# Patient Record
Sex: Female | Born: 1960 | Race: Black or African American | Hispanic: No | Marital: Married | Smoking: Never smoker
Health system: Southern US, Community
[De-identification: ages and names within clinical notes are randomized; demographics above are authoritative.]

## PROBLEM LIST (undated history)

## (undated) DIAGNOSIS — L309 Dermatitis, unspecified: Secondary | ICD-10-CM

## (undated) DIAGNOSIS — E039 Hypothyroidism, unspecified: Secondary | ICD-10-CM

## (undated) DIAGNOSIS — D219 Benign neoplasm of connective and other soft tissue, unspecified: Secondary | ICD-10-CM

## (undated) DIAGNOSIS — M4802 Spinal stenosis, cervical region: Secondary | ICD-10-CM

## (undated) DIAGNOSIS — I10 Essential (primary) hypertension: Secondary | ICD-10-CM

## (undated) DIAGNOSIS — Z5189 Encounter for other specified aftercare: Secondary | ICD-10-CM

## (undated) DIAGNOSIS — E559 Vitamin D deficiency, unspecified: Secondary | ICD-10-CM

## (undated) HISTORY — DX: Vitamin D deficiency, unspecified: E55.9

## (undated) HISTORY — DX: Encounter for other specified aftercare: Z51.89

## (undated) HISTORY — DX: Essential (primary) hypertension: I10

## (undated) HISTORY — DX: Dermatitis, unspecified: L30.9

## (undated) HISTORY — DX: Benign neoplasm of connective and other soft tissue, unspecified: D21.9

## (undated) HISTORY — DX: Spinal stenosis, cervical region: M48.02

## (undated) HISTORY — DX: Hypothyroidism, unspecified: E03.9

---

## 2000-06-07 ENCOUNTER — Other Ambulatory Visit: Admission: RE | Admit: 2000-06-07 | Discharge: 2000-06-07 | Payer: Self-pay | Admitting: Obstetrics and Gynecology

## 2001-01-01 ENCOUNTER — Ambulatory Visit (HOSPITAL_COMMUNITY): Admission: RE | Admit: 2001-01-01 | Discharge: 2001-01-01 | Payer: Self-pay | Admitting: Obstetrics and Gynecology

## 2001-01-01 ENCOUNTER — Encounter: Payer: Self-pay | Admitting: Obstetrics and Gynecology

## 2001-01-11 ENCOUNTER — Encounter: Admission: RE | Admit: 2001-01-11 | Discharge: 2001-01-11 | Payer: Self-pay | Admitting: Obstetrics and Gynecology

## 2001-01-11 ENCOUNTER — Encounter: Payer: Self-pay | Admitting: Obstetrics and Gynecology

## 2001-01-23 ENCOUNTER — Other Ambulatory Visit: Admission: RE | Admit: 2001-01-23 | Discharge: 2001-01-23 | Payer: Self-pay | Admitting: Obstetrics and Gynecology

## 2001-08-10 ENCOUNTER — Other Ambulatory Visit: Admission: RE | Admit: 2001-08-10 | Discharge: 2001-08-10 | Payer: Self-pay | Admitting: Obstetrics and Gynecology

## 2002-01-01 ENCOUNTER — Ambulatory Visit (HOSPITAL_COMMUNITY): Admission: RE | Admit: 2002-01-01 | Discharge: 2002-01-01 | Payer: Self-pay | Admitting: Obstetrics and Gynecology

## 2002-01-01 ENCOUNTER — Encounter: Payer: Self-pay | Admitting: Obstetrics and Gynecology

## 2002-10-01 ENCOUNTER — Other Ambulatory Visit: Admission: RE | Admit: 2002-10-01 | Discharge: 2002-10-01 | Payer: Self-pay | Admitting: Obstetrics and Gynecology

## 2003-01-02 ENCOUNTER — Encounter: Payer: Self-pay | Admitting: Obstetrics and Gynecology

## 2003-01-02 ENCOUNTER — Ambulatory Visit (HOSPITAL_COMMUNITY): Admission: RE | Admit: 2003-01-02 | Discharge: 2003-01-02 | Payer: Self-pay | Admitting: Obstetrics and Gynecology

## 2003-11-28 ENCOUNTER — Other Ambulatory Visit: Admission: RE | Admit: 2003-11-28 | Discharge: 2003-11-28 | Payer: Self-pay | Admitting: Obstetrics and Gynecology

## 2004-01-05 ENCOUNTER — Ambulatory Visit (HOSPITAL_COMMUNITY): Admission: RE | Admit: 2004-01-05 | Discharge: 2004-01-05 | Payer: Self-pay | Admitting: *Deleted

## 2004-01-09 ENCOUNTER — Encounter: Admission: RE | Admit: 2004-01-09 | Discharge: 2004-01-09 | Payer: Self-pay | Admitting: Obstetrics and Gynecology

## 2004-07-30 ENCOUNTER — Encounter: Admission: RE | Admit: 2004-07-30 | Discharge: 2004-07-30 | Payer: Self-pay | Admitting: Obstetrics and Gynecology

## 2004-11-29 ENCOUNTER — Other Ambulatory Visit: Admission: RE | Admit: 2004-11-29 | Discharge: 2004-11-29 | Payer: Self-pay | Admitting: Obstetrics and Gynecology

## 2004-12-06 ENCOUNTER — Encounter: Admission: RE | Admit: 2004-12-06 | Discharge: 2004-12-06 | Payer: Self-pay | Admitting: Obstetrics and Gynecology

## 2005-12-20 ENCOUNTER — Encounter: Admission: RE | Admit: 2005-12-20 | Discharge: 2005-12-20 | Payer: Self-pay | Admitting: Obstetrics and Gynecology

## 2005-12-29 ENCOUNTER — Encounter: Admission: RE | Admit: 2005-12-29 | Discharge: 2005-12-29 | Payer: Self-pay | Admitting: Obstetrics and Gynecology

## 2006-01-06 ENCOUNTER — Other Ambulatory Visit: Admission: RE | Admit: 2006-01-06 | Discharge: 2006-01-06 | Payer: Self-pay | Admitting: Obstetrics & Gynecology

## 2006-12-26 ENCOUNTER — Encounter: Admission: RE | Admit: 2006-12-26 | Discharge: 2006-12-26 | Payer: Self-pay | Admitting: Obstetrics and Gynecology

## 2007-01-18 ENCOUNTER — Other Ambulatory Visit: Admission: RE | Admit: 2007-01-18 | Discharge: 2007-01-18 | Payer: Self-pay | Admitting: Obstetrics & Gynecology

## 2007-12-27 ENCOUNTER — Encounter: Admission: RE | Admit: 2007-12-27 | Discharge: 2007-12-27 | Payer: Self-pay | Admitting: Obstetrics and Gynecology

## 2008-01-24 ENCOUNTER — Other Ambulatory Visit: Admission: RE | Admit: 2008-01-24 | Discharge: 2008-01-24 | Payer: Self-pay | Admitting: Obstetrics & Gynecology

## 2008-06-12 ENCOUNTER — Ambulatory Visit (HOSPITAL_COMMUNITY): Admission: RE | Admit: 2008-06-12 | Discharge: 2008-06-12 | Payer: Self-pay | Admitting: Pulmonary Disease

## 2009-01-02 ENCOUNTER — Encounter: Admission: RE | Admit: 2009-01-02 | Discharge: 2009-01-02 | Payer: Self-pay | Admitting: Obstetrics and Gynecology

## 2010-01-05 ENCOUNTER — Encounter: Admission: RE | Admit: 2010-01-05 | Discharge: 2010-01-05 | Payer: Self-pay | Admitting: Obstetrics and Gynecology

## 2010-06-27 ENCOUNTER — Encounter: Payer: Self-pay | Admitting: Otolaryngology

## 2010-06-27 ENCOUNTER — Encounter: Payer: Self-pay | Admitting: Obstetrics and Gynecology

## 2010-11-26 ENCOUNTER — Other Ambulatory Visit: Payer: Self-pay | Admitting: Obstetrics and Gynecology

## 2010-11-26 DIAGNOSIS — Z1231 Encounter for screening mammogram for malignant neoplasm of breast: Secondary | ICD-10-CM

## 2011-01-07 ENCOUNTER — Ambulatory Visit
Admission: RE | Admit: 2011-01-07 | Discharge: 2011-01-07 | Disposition: A | Discharge status: Home / Self Care | Payer: BC Managed Care – PPO | Source: Ambulatory Visit | Attending: Obstetrics and Gynecology | Admitting: Obstetrics and Gynecology

## 2011-01-07 DIAGNOSIS — Z1231 Encounter for screening mammogram for malignant neoplasm of breast: Secondary | ICD-10-CM

## 2011-12-19 ENCOUNTER — Other Ambulatory Visit: Payer: Self-pay | Admitting: Obstetrics and Gynecology

## 2011-12-19 DIAGNOSIS — Z1231 Encounter for screening mammogram for malignant neoplasm of breast: Secondary | ICD-10-CM

## 2012-01-09 ENCOUNTER — Ambulatory Visit
Admission: RE | Admit: 2012-01-09 | Discharge: 2012-01-09 | Disposition: A | Discharge status: Home / Self Care | Payer: BC Managed Care – PPO | Source: Ambulatory Visit | Attending: Obstetrics and Gynecology | Admitting: Obstetrics and Gynecology

## 2012-01-09 DIAGNOSIS — Z1231 Encounter for screening mammogram for malignant neoplasm of breast: Secondary | ICD-10-CM

## 2012-01-11 ENCOUNTER — Other Ambulatory Visit: Payer: Self-pay | Admitting: Obstetrics and Gynecology

## 2012-01-11 DIAGNOSIS — R928 Other abnormal and inconclusive findings on diagnostic imaging of breast: Secondary | ICD-10-CM

## 2012-01-18 ENCOUNTER — Ambulatory Visit
Admission: RE | Admit: 2012-01-18 | Discharge: 2012-01-18 | Disposition: A | Discharge status: Home / Self Care | Payer: BC Managed Care – PPO | Source: Ambulatory Visit | Attending: Obstetrics and Gynecology | Admitting: Obstetrics and Gynecology

## 2012-01-18 DIAGNOSIS — R928 Other abnormal and inconclusive findings on diagnostic imaging of breast: Secondary | ICD-10-CM

## 2012-12-13 ENCOUNTER — Other Ambulatory Visit: Payer: Self-pay

## 2012-12-13 DIAGNOSIS — Z1231 Encounter for screening mammogram for malignant neoplasm of breast: Secondary | ICD-10-CM

## 2013-01-09 ENCOUNTER — Ambulatory Visit
Admission: RE | Admit: 2013-01-09 | Discharge: 2013-01-09 | Disposition: A | Discharge status: Home / Self Care | Payer: BC Managed Care – PPO | Source: Ambulatory Visit

## 2013-01-09 DIAGNOSIS — Z1231 Encounter for screening mammogram for malignant neoplasm of breast: Secondary | ICD-10-CM

## 2013-01-22 ENCOUNTER — Other Ambulatory Visit (HOSPITAL_COMMUNITY): Payer: Self-pay | Admitting: Pulmonary Disease

## 2013-01-22 ENCOUNTER — Ambulatory Visit (INDEPENDENT_AMBULATORY_CARE_PROVIDER_SITE_OTHER): Payer: BC Managed Care – PPO | Admitting: Orthopedic Surgery

## 2013-01-22 ENCOUNTER — Ambulatory Visit (HOSPITAL_COMMUNITY)
Admission: RE | Admit: 2013-01-22 | Discharge: 2013-01-22 | Disposition: A | Discharge status: Home / Self Care | Payer: BC Managed Care – PPO | Source: Ambulatory Visit | Attending: Pulmonary Disease | Admitting: Pulmonary Disease

## 2013-01-22 ENCOUNTER — Encounter: Payer: Self-pay | Admitting: Orthopedic Surgery

## 2013-01-22 VITALS — BP 159/65 | Ht 67.5 in | Wt 185.0 lb

## 2013-01-22 DIAGNOSIS — S92901A Unspecified fracture of right foot, initial encounter for closed fracture: Secondary | ICD-10-CM

## 2013-01-22 DIAGNOSIS — S92909A Unspecified fracture of unspecified foot, initial encounter for closed fracture: Secondary | ICD-10-CM

## 2013-01-22 DIAGNOSIS — S92309A Fracture of unspecified metatarsal bone(s), unspecified foot, initial encounter for closed fracture: Secondary | ICD-10-CM | POA: Insufficient documentation

## 2013-01-22 DIAGNOSIS — W19XXXA Unspecified fall, initial encounter: Secondary | ICD-10-CM

## 2013-01-22 DIAGNOSIS — M25579 Pain in unspecified ankle and joints of unspecified foot: Secondary | ICD-10-CM | POA: Insufficient documentation

## 2013-01-22 MED ORDER — HYDROCODONE-ACETAMINOPHEN 5-325 MG PO TABS: 1.0000 | ORAL_TABLET | Freq: Four times a day (QID) | ORAL | Status: DC | PRN

## 2013-01-22 MED ORDER — IBUPROFEN 800 MG PO TABS: 800.0000 mg | ORAL_TABLET | Freq: Three times a day (TID) | ORAL | Status: DC | PRN

## 2013-01-22 NOTE — Progress Notes (Signed)
Patient ID: Jessica Mooney, female   DOB: 02-16-1961, 52 y.o.   MRN: [id number]  Chief Complaint  Patient presents with  . Foot Pain    5th metatarsal fracture right foot DOI 01/21/13 Referral from Dr. Juanetta Gosling    This patient fell down the stairs at her home on the 18th she injured her right foot she went to work she called her doctor he sent her for x-ray x-ray shows fracture of the fifth metatarsal in the shaft with minimal displacement. She has mild discomfort pain swelling minimal if any numbness or tingling  Pain level VII/X described as sharp and throbbing  Review of Systems  Endo/Heme/Allergies: Positive for environmental allergies.  All other systems reviewed and are negative.   BP 159/65  Ht 5' 7.5" (1.715 m)  Wt 185 lb (83.915 kg)  BMI 28.53 kg/m2  General appearance is normal, the patient is alert and oriented x3 with normal mood and affect.  The patient is angulating on her right foot primarily with weightbearing through the heel  She has no supportive devices.  Her right foot is swollen and tender over the fifth metatarsal the entire foot is swollen. Her ankle motion is not been effective Homero Fellers remained stable muscle tone is normal without atrophy skin is intact with ecchymosis but no lacerations. Pulses good sensation is normal  X-ray shows an oblique comminuted fracture of the fifth metatarsal the right foot without significant displacement  Recommend Cam Walker nonweightbearing for 8 weeks including a knee walker  Followup in 4 weeks for x-ray to check on healing and position of the fracture.

## 2013-01-22 NOTE — Patient Instructions (Signed)
Not weight bearing for 8 weeks

## 2013-02-19 ENCOUNTER — Ambulatory Visit: Payer: BC Managed Care – PPO | Admitting: Orthopedic Surgery

## 2013-03-05 ENCOUNTER — Ambulatory Visit (INDEPENDENT_AMBULATORY_CARE_PROVIDER_SITE_OTHER): Payer: BC Managed Care – PPO | Admitting: Orthopedic Surgery

## 2013-03-05 ENCOUNTER — Ambulatory Visit (INDEPENDENT_AMBULATORY_CARE_PROVIDER_SITE_OTHER): Payer: BC Managed Care – PPO

## 2013-03-05 VITALS — BP 159/99 | Ht 67.5 in | Wt 185.0 lb

## 2013-03-05 DIAGNOSIS — S92901D Unspecified fracture of right foot, subsequent encounter for fracture with routine healing: Secondary | ICD-10-CM

## 2013-03-05 DIAGNOSIS — S8290XD Unspecified fracture of unspecified lower leg, subsequent encounter for closed fracture with routine healing: Secondary | ICD-10-CM

## 2013-03-05 NOTE — Progress Notes (Signed)
Patient ID: Jessica Mooney, female   DOB: [birth year], 52 y.o.   MRN: [id number]  Chief Complaint  Patient presents with  . Follow-up    4 week recheck right foot with xray.   HISTORY: This is the four-week followup 6 weeks post injury on August 18 for a right fifth metatarsal fracture which was treated with a Cam Walker and minimal weightbearing using a knee walker.  She complains of numbness and tingling at the end of the fifth digit and between the fourth and fifth digit  X-ray shows healing nondisplaced oblique fifth metatarsal fracture  Recommend weight bear as tolerated in a Cam Walker return in 4 weeks for repeat x-ray clinical exam revealed tenderness at the fracture site. The numbness and tingling chronic swelling but perhaps may be due to entrapment of the digital nerve and the fracture callus  Encounter Diagnosis  Name Primary?  Marland Kitchen Foot fracture, right, with routine healing, subsequent encounter Yes

## 2013-03-05 NOTE — Patient Instructions (Addendum)
Weight bear as tolerated in boot

## 2013-03-07 ENCOUNTER — Ambulatory Visit: Payer: BC Managed Care – PPO | Admitting: Orthopedic Surgery

## 2013-04-02 ENCOUNTER — Ambulatory Visit: Payer: BC Managed Care – PPO | Admitting: Orthopedic Surgery

## 2013-04-04 ENCOUNTER — Ambulatory Visit (INDEPENDENT_AMBULATORY_CARE_PROVIDER_SITE_OTHER): Payer: BC Managed Care – PPO

## 2013-04-04 ENCOUNTER — Ambulatory Visit (INDEPENDENT_AMBULATORY_CARE_PROVIDER_SITE_OTHER): Payer: BC Managed Care – PPO | Admitting: Orthopedic Surgery

## 2013-04-04 VITALS — BP 172/110 | Ht 67.5 in | Wt 185.0 lb

## 2013-04-04 DIAGNOSIS — S92909A Unspecified fracture of unspecified foot, initial encounter for closed fracture: Secondary | ICD-10-CM | POA: Insufficient documentation

## 2013-04-04 DIAGNOSIS — S8290XD Unspecified fracture of unspecified lower leg, subsequent encounter for closed fracture with routine healing: Secondary | ICD-10-CM

## 2013-04-04 DIAGNOSIS — S92901D Unspecified fracture of right foot, subsequent encounter for fracture with routine healing: Secondary | ICD-10-CM

## 2013-04-04 NOTE — Patient Instructions (Signed)
You can take off the boot and wear regular shoes. Wait 2 weeks for high heel shoes

## 2013-04-04 NOTE — Progress Notes (Signed)
Patient ID: Jessica Mooney, female   DOB: 02/16/1961, 52 y.o.   MRN: [id number] Chief Complaint  Patient presents with  . Follow-up    4 week recheck right foot fracture DOI 01/21/13   This is the eighth week since the original fracture x-ray show fracture healing patient's symptoms have all but gone away her for Lyme looks good she has no tenderness she is encouraged to return to normal activities in normal shoes

## 2013-04-16 ENCOUNTER — Telehealth: Payer: Self-pay | Admitting: Orthopedic Surgery

## 2013-04-16 NOTE — Telephone Encounter (Signed)
Jessica Mooney is still having a problem trying to wear regular shoes.  She has bought a good pair of tennis shoes but her foot still swells.  Says she elevates and ices  As much as she can.  She said she is still limping, but that may be just habit.  She is asking what type of shoe or brace do you advise she wear.

## 2013-04-17 NOTE — Telephone Encounter (Signed)
No shoe or brace  Recommend TED hose

## 2013-04-17 NOTE — Telephone Encounter (Signed)
Patient informed of Dr. Harrison's reply. 

## 2013-05-16 ENCOUNTER — Encounter: Payer: Self-pay | Admitting: Obstetrics and Gynecology

## 2013-05-17 ENCOUNTER — Encounter: Payer: Self-pay | Admitting: Obstetrics and Gynecology

## 2013-05-17 ENCOUNTER — Ambulatory Visit: Payer: Self-pay | Admitting: Obstetrics and Gynecology

## 2013-05-17 ENCOUNTER — Ambulatory Visit (INDEPENDENT_AMBULATORY_CARE_PROVIDER_SITE_OTHER): Payer: BC Managed Care – PPO | Admitting: Obstetrics and Gynecology

## 2013-05-17 VITALS — BP 140/84 | HR 80 | Resp 18 | Ht 67.25 in | Wt 223.0 lb

## 2013-05-17 DIAGNOSIS — Z1211 Encounter for screening for malignant neoplasm of colon: Secondary | ICD-10-CM

## 2013-05-17 DIAGNOSIS — Z0000 Encounter for general adult medical examination without abnormal findings: Secondary | ICD-10-CM

## 2013-05-17 DIAGNOSIS — Z01419 Encounter for gynecological examination (general) (routine) without abnormal findings: Secondary | ICD-10-CM

## 2013-05-17 LAB — POCT URINALYSIS DIPSTICK
Blood, UA: NEGATIVE
Glucose, UA: NEGATIVE
Urobilinogen, UA: NEGATIVE
pH, UA: 5

## 2013-05-17 NOTE — Progress Notes (Signed)
Patient ID: Jessica Mooney, female   DOB: Feb 18, 1961, 52 y.o.   MRN: [id number] GYNECOLOGY VISIT  PCP:    Kari Baars, MD  Referring provider:   HPI: 52 y.o.   Married  [place] American  female   [phone] with Patient's last menstrual period was 07/07/2010.   here for  AEX.  Hot flashes.  Not sleeping well at night.  Her two adopted daughters are staying with their birth family now, and patient does not have contact with them. She hears about them only through friends who have seen them Worried about one of the daughters who is on a liver transplant list.   Broke right foot in August - twisted foot going down the steps at home. Wore a boot for months and has been inactive due to this.   Hgb:    PCP Urine:  Trace WBC's (asymptomatic)  GYNECOLOGIC HISTORY: Patient's last menstrual period was 07/07/2010. Sexually active:  yes Partner preference: female Contraception: postmenopausal   Menopausal hormone therapy: none DES exposure:   no Blood transfusions:   30 years ago after c-section Sexually transmitted diseases:   no GYN Procedures: C-section 1989  Mammogram:   01-09-13 wnl:[facility]              Pap:  05-01-12 wnl:neg HR HPV  History of abnormal pap smear:  Abnormal pap 10-15 years ago in [place].  Patient transferred care To our practice and pap was repeated and was completely normal.  No colposcopy was done and  paps have been normal since.   OB History   Grav Para Term Preterm Abortions TAB SAB Ect Mult Living   4 2 1  2     1        LIFESTYLE: Exercise:   walking            Tobacco:   no Alcohol:      no Drug use:   no  OTHER HEALTH MAINTENANCE: Tetanus/TDap:  2011 Gardisil:  n/a Influenza:  never Zostavax:  n/a  Bone density:  never Colonoscopy:  never  Cholesterol check: 2014 wnl  Family History  Problem Relation Age of Onset  . Breast cancer Sister 45  . Hypertension Mother   . Thyroid disease Mother     thyroidectomy  . Hypertension  Father   . Stroke Maternal Grandfather     Patient Active Problem List   Diagnosis Date Noted  . Foot fracture 04/04/2013   Past Medical History  Diagnosis Date  . Hypertension   . Fibroid   . Blood transfusion without reported diagnosis     age 24    Past Surgical History  Procedure Laterality Date  . Cesarean section  1989    ALLERGIES: Sulfur  Current Outpatient Prescriptions  Medication Sig Dispense Refill  . bisoprolol-hydrochlorothiazide (ZIAC) 5-6.25 MG per tablet Take 1 tablet by mouth daily.      . Multiple Vitamin (MULTIVITAMIN) tablet Take 1 tablet by mouth daily.      Marland Kitchen olmesartan (BENICAR) 40 MG tablet Take 40 mg by mouth daily.       No current facility-administered medications for this visit.     ROS:  Pertinent items are noted in HPI.  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  PHYSICAL EXAMINATION:    BP 140/84  Pulse 80  Resp 18  Ht 5' 7.25" (1.708 m)  Wt 223 lb (101.152 kg)  BMI 34.67 kg/m2  LMP 07/07/2010   Wt  Readings from Last 3 Encounters:  05/17/13 223 lb (101.152 kg)  04/04/13 185 lb (83.915 kg)  03/05/13 185 lb (83.915 kg)     Ht Readings from Last 3 Encounters:  05/17/13 5' 7.25" (1.708 m)  04/04/13 5' 7.5" (1.715 m)  03/05/13 5' 7.5" (1.715 m)    General appearance: alert, cooperative and appears stated age Head: Normocephalic, without obvious abnormality, atraumatic Neck: no adenopathy, supple, symmetrical, trachea midline and thyroid not enlarged, symmetric, no tenderness/mass/nodules Lungs: clear to auscultation bilaterally Breasts: Inspection negative, No nipple retraction or dimpling, No nipple discharge or bleeding, No axillary or supraclavicular adenopathy, Normal to palpation without dominant masses Heart: regular rate and rhythm Abdomen: soft, non-tender; no masses,  no organomegaly Extremities: extremities normal, atraumatic, no cyanosis or edema Skin: Skin color, texture, turgor normal. No rashes or  lesions Lymph nodes: Cervical, supraclavicular, and axillary nodes normal. No abnormal inguinal nodes palpated Neurologic: Grossly normal  Pelvic: External genitalia:  no lesions              Urethra:  normal appearing urethra with no masses, tenderness or lesions              Bartholins and Skenes: normal                 Vagina: normal appearing vagina with normal color and discharge, left vaginal wall cyst 1.5 - soft and nontender.              Cervix: normal appearance              Pap and high risk HPV testing done: no.            Bimanual Exam:  Uterus:  uterus is normal size, shape, consistency and nontender                                      Adnexa: normal adnexa in size, nontender and no masses                                      Rectovaginal: Confirms                                      Anus:  normal sphincter tone, no lesions  ASSESSMENT  Normal gynecologic exam. Menopausal symptoms.  Fracture of foot.  PLAN  Mammogram yearly.  Pap smear and high risk HPV testing not indicated.  Colonoscopy due. Referral to Dr. Loreta Ave.  Consider doing bone density next year due to foot fracture. Discussed adequate calcium and vitamin D intake.  Return annually or prn   An After Visit Summary was printed and given to the patient.

## 2013-06-07 ENCOUNTER — Telehealth: Payer: Self-pay | Admitting: Obstetrics and Gynecology

## 2013-06-07 NOTE — Telephone Encounter (Signed)
Return call to patient.  Per last OV note from Dr Quincy Simmonds, refer to Dr Collene Mares ([phone])for colonoscopy. LMTCB.

## 2013-06-07 NOTE — Telephone Encounter (Signed)
Patient said we referred her to someone for a colonoscopy but she doesn't remember who it was to.

## 2013-06-10 ENCOUNTER — Telehealth: Payer: Self-pay | Admitting: Obstetrics and Gynecology

## 2013-06-10 NOTE — Telephone Encounter (Signed)
Voicemail confirmed mobile telephone #. Left message advising patient that she is scheduled to see Dr Collene Mares Jul 04, 2013 @4p .

## 2013-12-02 ENCOUNTER — Other Ambulatory Visit: Payer: Self-pay

## 2013-12-02 DIAGNOSIS — Z1231 Encounter for screening mammogram for malignant neoplasm of breast: Secondary | ICD-10-CM

## 2014-01-10 ENCOUNTER — Ambulatory Visit
Admission: RE | Admit: 2014-01-10 | Discharge: 2014-01-10 | Disposition: A | Discharge status: Home / Self Care | Payer: BC Managed Care – PPO | Source: Ambulatory Visit

## 2014-01-10 DIAGNOSIS — Z1231 Encounter for screening mammogram for malignant neoplasm of breast: Secondary | ICD-10-CM

## 2014-02-05 ENCOUNTER — Other Ambulatory Visit (HOSPITAL_COMMUNITY): Payer: Self-pay | Admitting: Pulmonary Disease

## 2014-02-05 DIAGNOSIS — Z780 Asymptomatic menopausal state: Secondary | ICD-10-CM

## 2014-02-11 ENCOUNTER — Ambulatory Visit (HOSPITAL_COMMUNITY)
Admission: RE | Admit: 2014-02-11 | Discharge: 2014-02-11 | Disposition: A | Discharge status: Home / Self Care | Payer: BC Managed Care – PPO | Source: Ambulatory Visit | Attending: Pulmonary Disease | Admitting: Pulmonary Disease

## 2014-02-11 DIAGNOSIS — Z13820 Encounter for screening for osteoporosis: Secondary | ICD-10-CM | POA: Diagnosis not present

## 2014-02-11 DIAGNOSIS — Z780 Asymptomatic menopausal state: Secondary | ICD-10-CM | POA: Diagnosis present

## 2014-02-11 LAB — HM DEXA SCAN: HM Dexa Scan: NORMAL

## 2014-02-18 ENCOUNTER — Encounter: Payer: Self-pay | Admitting: Obstetrics and Gynecology

## 2014-04-07 ENCOUNTER — Encounter: Payer: Self-pay | Admitting: Obstetrics and Gynecology

## 2014-05-23 ENCOUNTER — Encounter: Payer: Self-pay | Admitting: Obstetrics and Gynecology

## 2014-05-23 ENCOUNTER — Ambulatory Visit (INDEPENDENT_AMBULATORY_CARE_PROVIDER_SITE_OTHER): Payer: BC Managed Care – PPO | Admitting: Obstetrics and Gynecology

## 2014-05-23 ENCOUNTER — Ambulatory Visit: Payer: BC Managed Care – PPO | Admitting: Obstetrics and Gynecology

## 2014-05-23 VITALS — BP 132/86 | HR 64 | Resp 16 | Ht 68.0 in | Wt 234.4 lb

## 2014-05-23 DIAGNOSIS — R82998 Other abnormal findings in urine: Secondary | ICD-10-CM

## 2014-05-23 DIAGNOSIS — Z01419 Encounter for gynecological examination (general) (routine) without abnormal findings: Secondary | ICD-10-CM

## 2014-05-23 DIAGNOSIS — N390 Urinary tract infection, site not specified: Secondary | ICD-10-CM

## 2014-05-23 DIAGNOSIS — Z0000 Encounter for general adult medical examination without abnormal findings: Secondary | ICD-10-CM

## 2014-05-23 LAB — POCT URINALYSIS DIPSTICK
Bilirubin, UA: NEGATIVE
Blood, UA: NEGATIVE
Glucose, UA: NEGATIVE
Ketones, UA: NEGATIVE
Nitrite, UA: NEGATIVE
Protein, UA: NEGATIVE
Urobilinogen, UA: NEGATIVE
pH, UA: 5

## 2014-05-23 NOTE — Progress Notes (Signed)
Patient ID: Jessica Mooney, female   DOB: Feb 17, 1961, 53 y.o.   MRN: [id number] 53 y.o. [id number] MarriedAfrican AmericanF here for annual exam.    Still with some hot flashes.  Declines Rx.   Daughter in ICU at [facility].  Has ascites and is on a liver transplant list.  Will be discharged on Monday or Tuesday.   Has more contact with her adopted daughters this year.   Working at [employer].  Going to [place].   PCP:  Jessica Du, MD  Patient's last menstrual period was 07/07/2010.          Sexually active: Yes.   female The current method of family planning is post menopausal status.    Exercising: Yes.    walking. Smoker:  no  Health Maintenance: Pap:  05-01-12 wnl:neg HR HPV History of abnormal Pap:  no MMG:  01-10-14 heterogeneously dense/nl:[facility] Colonoscopy:  Scheduled for first colonoscopy 06/2014 with Dr. Collene Mooney. BMD:   02-11-14 normal:Jessica Mooney TDaP:  2011 Screening Labs:  Hb today: PCP, Urine today: 1+WBC's - no dysuria. Last UTI was a long time ago.    reports that she has never smoked. She does not have any smokeless tobacco history on file. She reports that she does not drink alcohol or use illicit drugs.  Past Medical History  Diagnosis Date  . Hypertension   . Fibroid   . Blood transfusion without reported diagnosis     age 81    Past Surgical History  Procedure Laterality Date  . Cesarean section  1989    Current Outpatient Prescriptions  Medication Sig Dispense Refill  . amLODipine (NORVASC) 5 MG tablet Take 5 mg by mouth daily.    . bisoprolol-hydrochlorothiazide (ZIAC) 5-6.25 MG per tablet Take 1 tablet by mouth daily.    . Multiple Vitamin (MULTIVITAMIN) tablet Take 1 tablet by mouth daily.     No current facility-administered medications for this visit.    Family History  Problem Relation Age of Onset  . Breast cancer Sister 41  . Hypertension Mother   . Thyroid disease Mother     thyroidectomy  .  Hypertension Father   . Stroke Maternal Grandfather     ROS:  Pertinent items are noted in HPI.  Otherwise, a comprehensive ROS was negative.  Exam:   BP 132/86 mmHg  Pulse 64  Resp 16  Ht 5\' 8"  (1.727 m)  Wt 234 lb 6.4 oz (106.323 kg)  BMI 35.65 kg/m2  LMP 07/07/2010      Height: 5\' 8"  (172.7 cm)  Ht Readings from Last 3 Encounters:  05/23/14 5\' 8"  (1.727 m)  05/17/13 5' 7.25" (1.708 m)  04/04/13 5' 7.5" (1.715 m)    General appearance: alert, cooperative and appears stated age Head: Normocephalic, without obvious abnormality, atraumatic Neck: no adenopathy, supple, symmetrical, trachea midline and thyroid normal to inspection and palpation Lungs: clear to auscultation bilaterally Breasts: normal appearance, no masses or tenderness, Inspection negative, No nipple retraction or dimpling, No nipple discharge or bleeding, No axillary or supraclavicular adenopathy Heart: regular rate and rhythm Abdomen: low vertical midline incision, soft, non-tender; bowel sounds normal; no masses,  no organomegaly Extremities: extremities normal, atraumatic, no cyanosis or edema Skin: Skin color, texture, turgor normal. No rashes or lesions Lymph nodes: Cervical, supraclavicular, and axillary nodes normal. No abnormal inguinal nodes palpated Neurologic: Grossly normal   Pelvic: External genitalia:  no lesions  Urethra:  normal appearing urethra with no masses, tenderness or lesions              Bartholins and Skenes: normal                 Vagina: normal appearing vagina with normal color and discharge, no lesions              Cervix: no lesions              Pap taken: No. Bimanual Exam:  Uterus:  normal size, contour, position, consistency, mobility, non-tender              Adnexa: normal adnexa and no mass, fullness, tenderness               Rectovaginal: Confirms               Anus:  normal sphincter tone, no lesions  Chaperone was present for exam.  A:  Well Woman with  normal exam FH breast cancer.  Abnormal urine dip.  P:   Mammogram yearly.  pap smear not indicated.  Urine culture.  return annually or prn

## 2014-05-23 NOTE — Patient Instructions (Signed)

## 2014-05-25 LAB — URINE CULTURE

## 2014-06-06 DIAGNOSIS — M4802 Spinal stenosis, cervical region: Secondary | ICD-10-CM

## 2014-06-06 HISTORY — DX: Spinal stenosis, cervical region: M48.02

## 2014-12-04 ENCOUNTER — Other Ambulatory Visit: Payer: Self-pay

## 2014-12-04 DIAGNOSIS — Z1231 Encounter for screening mammogram for malignant neoplasm of breast: Secondary | ICD-10-CM

## 2015-01-09 ENCOUNTER — Ambulatory Visit (HOSPITAL_COMMUNITY)
Admission: RE | Admit: 2015-01-09 | Discharge: 2015-01-09 | Disposition: A | Discharge status: Home / Self Care | Payer: BC Managed Care – PPO | Source: Ambulatory Visit | Attending: Pulmonary Disease | Admitting: Pulmonary Disease

## 2015-01-09 ENCOUNTER — Other Ambulatory Visit (HOSPITAL_COMMUNITY): Payer: Self-pay | Admitting: Pulmonary Disease

## 2015-01-09 DIAGNOSIS — M542 Cervicalgia: Secondary | ICD-10-CM | POA: Insufficient documentation

## 2015-01-09 DIAGNOSIS — M2560 Stiffness of unspecified joint, not elsewhere classified: Secondary | ICD-10-CM

## 2015-01-09 DIAGNOSIS — R202 Paresthesia of skin: Secondary | ICD-10-CM | POA: Diagnosis not present

## 2015-01-09 DIAGNOSIS — M5032 Other cervical disc degeneration, mid-cervical region: Secondary | ICD-10-CM | POA: Diagnosis not present

## 2015-01-14 ENCOUNTER — Ambulatory Visit
Admission: RE | Admit: 2015-01-14 | Discharge: 2015-01-14 | Disposition: A | Discharge status: Home / Self Care | Payer: BC Managed Care – PPO | Source: Ambulatory Visit

## 2015-01-14 DIAGNOSIS — Z1231 Encounter for screening mammogram for malignant neoplasm of breast: Secondary | ICD-10-CM

## 2015-02-20 ENCOUNTER — Other Ambulatory Visit (HOSPITAL_COMMUNITY): Payer: Self-pay | Admitting: Pulmonary Disease

## 2015-02-20 DIAGNOSIS — M542 Cervicalgia: Secondary | ICD-10-CM

## 2015-03-04 ENCOUNTER — Ambulatory Visit (HOSPITAL_COMMUNITY)
Admission: RE | Admit: 2015-03-04 | Discharge: 2015-03-04 | Disposition: A | Discharge status: Home / Self Care | Payer: BC Managed Care – PPO | Source: Ambulatory Visit | Attending: Pulmonary Disease | Admitting: Pulmonary Disease

## 2015-03-04 DIAGNOSIS — M542 Cervicalgia: Secondary | ICD-10-CM

## 2015-03-04 DIAGNOSIS — M4802 Spinal stenosis, cervical region: Secondary | ICD-10-CM | POA: Diagnosis not present

## 2015-06-12 ENCOUNTER — Ambulatory Visit (INDEPENDENT_AMBULATORY_CARE_PROVIDER_SITE_OTHER): Payer: BC Managed Care – PPO | Admitting: Obstetrics and Gynecology

## 2015-06-12 ENCOUNTER — Encounter: Payer: Self-pay | Admitting: Obstetrics and Gynecology

## 2015-06-12 ENCOUNTER — Ambulatory Visit: Payer: BC Managed Care – PPO | Admitting: Obstetrics and Gynecology

## 2015-06-12 VITALS — BP 124/76 | HR 60 | Ht 67.25 in | Wt 223.0 lb

## 2015-06-12 DIAGNOSIS — Z0000 Encounter for general adult medical examination without abnormal findings: Secondary | ICD-10-CM | POA: Diagnosis not present

## 2015-06-12 DIAGNOSIS — N390 Urinary tract infection, site not specified: Secondary | ICD-10-CM

## 2015-06-12 DIAGNOSIS — Z01419 Encounter for gynecological examination (general) (routine) without abnormal findings: Secondary | ICD-10-CM

## 2015-06-12 DIAGNOSIS — R82998 Other abnormal findings in urine: Secondary | ICD-10-CM

## 2015-06-12 LAB — POCT URINALYSIS DIPSTICK
BILIRUBIN UA: NEGATIVE
Blood, UA: NEGATIVE
GLUCOSE UA: NEGATIVE
Ketones, UA: NEGATIVE
Nitrite, UA: NEGATIVE
Protein, UA: NEGATIVE
UROBILINOGEN UA: NEGATIVE
pH, UA: 5

## 2015-06-12 NOTE — Patient Instructions (Signed)

## 2015-06-12 NOTE — Progress Notes (Signed)
Patient ID: Jessica Mooney, female   DOB: 02-09-1961, 55 y.o.   MRN: [id number]  55 y.o. [id number] Married [place] American female here for annual exam.    Had problems with right arm and right leg numbness.  Had an MRI and was diagnosed with cervical spine foraminal stenosis. Is now treated for pain.  Has LLQ pain every now and then.  U/S several years ago - normal  This is not a problem for the patient.   New diagnosis of hypothyroidism.  Not sure if she feels different taking thyroid hormone.   Struggling with weight.  Taking vit D 10,000 IU daily for vit D deficiency.  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  Hoping to be at [facility] [employer] [facility].  PCP: Sinda Du, MD    Patient's last menstrual period was 07/07/2010.          Sexually active: Yes.    The current method of family planning is post menopausal status.    Exercising: Yes.    walking almost everyday Smoker:  no  Health Maintenance: Pap:  05/01/12, negative with neg HR HPV History of abnormal Pap:  no MMG: 01/14/15, 3D, heterogeneously dense/nl:[facility] Colonoscopy:Never.  Difficulty with prep so unable to complete the colonoscopy.  BMD: 02-11-14 normal:[facility] TDaP:  2011 Screening Labs: October 2016; Urine Dip:1+WBCs--sent for urine culture & Micro.  Sometimes has urgency.     reports that she has never smoked. She has never used smokeless tobacco. She reports that she does not drink alcohol or use illicit drugs.  Past Medical History  Diagnosis Date  . Hypertension   . Fibroid   . Blood transfusion without reported diagnosis     age 68  . Hypothyroidism   . Vitamin D deficiency   . Foraminal stenosis of cervical region 2016    Past Surgical History  Procedure Laterality Date  . Cesarean section  1989    Current Outpatient Prescriptions  Medication Sig Dispense Refill  . amLODipine (NORVASC) 5 MG tablet Take 5 mg by mouth daily.    Francia Greaves THYROID 15 MG tablet Take 1 tablet by mouth daily.     . bisoprolol-hydrochlorothiazide (ZIAC) 5-6.25 MG per tablet Take 1 tablet by mouth daily.    . Multiple Vitamin (MULTIVITAMIN) tablet Take 1 tablet by mouth daily.    Marland Kitchen TROKENDI XR 25 MG CP24 Take 1 tablet by mouth daily.     No current facility-administered medications for this visit.    Family History  Problem Relation Age of Onset  . Breast cancer Sister 12  . Hypertension Mother   . Thyroid disease Mother     thyroidectomy  . Hypertension Father   . Stroke Maternal Grandfather     ROS:  Pertinent items are noted in HPI.  Otherwise, a comprehensive ROS was negative.  Exam:   BP 124/76 mmHg  Pulse 60  Ht 5' 7.25" (1.708 m)  Wt 223 lb (101.152 kg)  BMI 34.67 kg/m2  LMP 07/07/2010    General appearance: alert, cooperative and appears stated age Head: Normocephalic, without obvious abnormality, atraumatic Neck: no adenopathy, supple, symmetrical, trachea midline and thyroid normal to inspection and palpation Lungs: clear to auscultation bilaterally Breasts: normal appearance, no masses or tenderness, Inspection negative, No nipple retraction or dimpling, No nipple discharge or bleeding, No axillary or supraclavicular adenopathy Heart: regular rate and rhythm Abdomen: Pfannenstiel incision and low vertical midline incision, soft, non-tender; bowel sounds normal; no masses,  no organomegaly Extremities: extremities normal, atraumatic,  no cyanosis or edema Skin: Skin color, texture, turgor normal. No rashes or lesions Lymph nodes: Cervical, supraclavicular, and axillary nodes normal. No abnormal inguinal nodes palpated Neurologic: Grossly normal  Pelvic: External genitalia:  no lesions              Urethra:  normal appearing urethra with no masses, tenderness or lesions              Bartholins and Skenes: normal                 Vagina: normal appearing vagina with normal color and discharge, no lesions              Cervix: no lesions              Pap taken: Yes.   Bimanual  Exam:  Uterus:  normal size, contour, position, consistency, mobility, non-tender              Adnexa: normal adnexa and no mass, fullness, tenderness              Rectovaginal: Yes.  .  Confirms.              Anus:  normal sphincter tone, no lesions  Chaperone was present for exam.  Assessment:   Well woman visit with normal exam. FH of breast cancer.  Leukocytes in urine.  Urgency - not consistent. Hypothyroidism.  Menopausal female.   Plan: Yearly mammogram recommended after age 31.  Recommended self breast exam.  Pap and HR HPV as above. Discussed Calcium, Vitamin D, regular exercise program including cardiovascular and weight bearing exercise. Labs performed.  Yes.  Urine micro and culture. No abx at this time. Refills given on medications.  No..    Discussion of alternatives to colonoscopy.  Patient will return to her GI or seek second opinion at another GI office.  Discussed hypothyroidism and the importance of continued treatment.  Follow up annually and prn.     After visit summary provided.

## 2015-06-13 ENCOUNTER — Encounter: Payer: Self-pay | Admitting: Obstetrics and Gynecology

## 2015-06-13 LAB — URINALYSIS, MICROSCOPIC ONLY
BACTERIA UA: NONE SEEN [HPF]
Casts: NONE SEEN [LPF]
Crystals: NONE SEEN [HPF]
RBC / HPF: NONE SEEN RBC/HPF (ref <=2)
SQUAMOUS EPITHELIAL / LPF: NONE SEEN [HPF] (ref <=5)
YEAST: NONE SEEN [HPF]

## 2015-06-14 LAB — URINE CULTURE: Colony Count: >=100000

## 2015-06-16 LAB — IPS PAP TEST WITH HPV

## 2015-06-22 ENCOUNTER — Other Ambulatory Visit: Payer: Self-pay

## 2015-06-22 ENCOUNTER — Telehealth: Payer: Self-pay | Admitting: Obstetrics and Gynecology

## 2015-06-22 DIAGNOSIS — Z1231 Encounter for screening mammogram for malignant neoplasm of breast: Secondary | ICD-10-CM

## 2015-06-22 NOTE — Telephone Encounter (Signed)
Patient received results thru Mychart but didn't understand them and would like a call from the nurse to go over.

## 2015-06-22 NOTE — Telephone Encounter (Signed)
Notes Recorded by Lowella Fairy, CMA on 06/22/2015 at 9:14 AM 06-11-16 02 recall entered. Notes Recorded by Nunzio Cobbs, MD on 06/18/2015 at 2:48 PM Pap and HR HPV negative.  Recall - 02. Notes Recorded by Nunzio Cobbs, MD on 06/15/2015 at 1:12 PM Results to patient through My Chart. No treatment for UTI at this time. Notes Recorded by Nunzio Cobbs, MD on 06/13/2015 at 2:39 PM Results to patient through My Chart. Urine micro to patient.  UC and pap pending.  Patient Result Comments     Entered by Nunzio Cobbs, MD at 06/15/2015 1:11 PM    Read by Lyda Perone at 06/18/2015 9:36 PM    Hello Ms. Mandarino,   Your urine culture result is showing mixed bacterial organisms. No one predominant organism grew, so this is considered a negative urine culture.    I am not planning to treat with antibiotics unless you contact the office back and are having pain or discomfort with urination.   Please call the office for any concerns!   Josefa Half, MD

## 2015-06-22 NOTE — Telephone Encounter (Signed)
Spoke with patient. Advised of message as seen below from [place]. All questions answered. Patient verbalizes understanding.  Routing to provider for final review. Patient agreeable to disposition. Will close encounter.

## 2015-12-21 ENCOUNTER — Ambulatory Visit (INDEPENDENT_AMBULATORY_CARE_PROVIDER_SITE_OTHER): Payer: BC Managed Care – PPO | Admitting: Otolaryngology

## 2015-12-21 DIAGNOSIS — H8111 Benign paroxysmal vertigo, right ear: Secondary | ICD-10-CM | POA: Diagnosis not present

## 2015-12-21 DIAGNOSIS — H6123 Impacted cerumen, bilateral: Secondary | ICD-10-CM | POA: Diagnosis not present

## 2015-12-21 DIAGNOSIS — R42 Dizziness and giddiness: Secondary | ICD-10-CM | POA: Diagnosis not present

## 2016-01-18 ENCOUNTER — Ambulatory Visit
Admission: RE | Admit: 2016-01-18 | Discharge: 2016-01-18 | Disposition: A | Discharge status: Home / Self Care | Payer: BC Managed Care – PPO | Source: Ambulatory Visit

## 2016-01-18 ENCOUNTER — Ambulatory Visit (INDEPENDENT_AMBULATORY_CARE_PROVIDER_SITE_OTHER): Payer: BC Managed Care – PPO | Admitting: Otolaryngology

## 2016-01-18 DIAGNOSIS — Z1231 Encounter for screening mammogram for malignant neoplasm of breast: Secondary | ICD-10-CM

## 2016-01-18 DIAGNOSIS — H8111 Benign paroxysmal vertigo, right ear: Secondary | ICD-10-CM

## 2016-06-10 NOTE — Progress Notes (Deleted)
56 y.o. [id number] Married [place] American female here for annual exam.    PCP:     Patient's last menstrual period was 07/07/2010.           Sexually active: {yes no:314532}  The current method of family planning is post menopausal status.    Exercising: {yes no:314532}  {types:19826} Smoker:  no  Health Maintenance: Pap:  06-15-15 Neg:Neg HR HPV History of abnormal Pap:  no MMG:  01-18-16 Density C/Neg/BiRads1:TBC Colonoscopy: Never.  Difficulty with prep so unable to complete the colonoscopy.   BMD:  02-11-14  Result  Normal:[place] TDaP:  2011 Gardasil:   N/A HIV: Hep C: Screening Labs:  Hb today: ***, Urine today: ***   reports that she has never smoked. She has never used smokeless tobacco. She reports that she does not drink alcohol or use drugs.  Past Medical History:  Diagnosis Date  . Blood transfusion without reported diagnosis    age 57  . Fibroid   . Foraminal stenosis of cervical region 2016  . Hypertension   . Hypothyroidism   . Vitamin D deficiency     Past Surgical History:  Procedure Laterality Date  . CESAREAN SECTION  1989    Current Outpatient Prescriptions  Medication Sig Dispense Refill  . amLODipine (NORVASC) 5 MG tablet Take 5 mg by mouth daily.    Francia Greaves THYROID 15 MG tablet Take 1 tablet by mouth daily.    . bisoprolol-hydrochlorothiazide (ZIAC) 5-6.25 MG per tablet Take 1 tablet by mouth daily.    . Multiple Vitamin (MULTIVITAMIN) tablet Take 1 tablet by mouth daily.    Marland Kitchen TROKENDI XR 25 MG CP24 Take 1 tablet by mouth daily.     No current facility-administered medications for this visit.     Family History  Problem Relation Age of Onset  . Breast cancer Sister 26  . Hypertension Mother   . Thyroid disease Mother     thyroidectomy  . Hypertension Father   . Stroke Maternal Grandfather     ROS:  Pertinent items are noted in HPI.  Otherwise, a comprehensive ROS was negative.  Exam:   LMP 07/07/2010     General appearance:  alert, cooperative and appears stated age Head: Normocephalic, without obvious abnormality, atraumatic Neck: no adenopathy, supple, symmetrical, trachea midline and thyroid normal to inspection and palpation Lungs: clear to auscultation bilaterally Breasts: normal appearance, no masses or tenderness, No nipple retraction or dimpling, No nipple discharge or bleeding, No axillary or supraclavicular adenopathy Heart: regular rate and rhythm Abdomen: soft, non-tender; no masses, no organomegaly Extremities: extremities normal, atraumatic, no cyanosis or edema Skin: Skin color, texture, turgor normal. No rashes or lesions Lymph nodes: Cervical, supraclavicular, and axillary nodes normal. No abnormal inguinal nodes palpated Neurologic: Grossly normal  Pelvic: External genitalia:  no lesions              Urethra:  normal appearing urethra with no masses, tenderness or lesions              Bartholins and Skenes: normal                 Vagina: normal appearing vagina with normal color and discharge, no lesions              Cervix: no lesions              Pap taken: {yes no:314532} Bimanual Exam:  Uterus:  normal size, contour, position, consistency, mobility, non-tender  Adnexa: no mass, fullness, tenderness              Rectal exam: {yes no:314532}.  Confirms.              Anus:  normal sphincter tone, no lesions  Chaperone was present for exam.  Assessment:   Well woman visit with normal exam.   Plan: Mammogram screening discussed. Recommended self breast awareness. Pap and HR HPV as above. Guidelines for Calcium, Vitamin D, regular exercise program including cardiovascular and weight bearing exercise.   Follow up annually and prn.   Additional counseling given.  {yes [id number]. _______ minutes face to face time of which over 50% was spent in counseling.    After visit summary provided.

## 2016-06-13 ENCOUNTER — Ambulatory Visit: Payer: BC Managed Care – PPO | Admitting: Obstetrics and Gynecology

## 2016-06-20 ENCOUNTER — Ambulatory Visit: Payer: BC Managed Care – PPO | Admitting: Obstetrics and Gynecology

## 2016-06-20 ENCOUNTER — Ambulatory Visit (INDEPENDENT_AMBULATORY_CARE_PROVIDER_SITE_OTHER): Payer: BC Managed Care – PPO | Admitting: Obstetrics and Gynecology

## 2016-06-20 ENCOUNTER — Encounter: Payer: Self-pay | Admitting: Obstetrics and Gynecology

## 2016-06-20 VITALS — BP 138/76 | HR 66 | Resp 16 | Ht 67.5 in | Wt 218.8 lb

## 2016-06-20 DIAGNOSIS — Z634 Disappearance and death of family member: Secondary | ICD-10-CM

## 2016-06-20 DIAGNOSIS — Z01419 Encounter for gynecological examination (general) (routine) without abnormal findings: Secondary | ICD-10-CM | POA: Diagnosis not present

## 2016-06-20 NOTE — Progress Notes (Signed)
56 y.o. [id number] Married [place] American female here for annual exam.    Unable to do colonoscopy due to intolerance of bowel prep.  Daughter deceased of liver disease this last year.  Going through the years of firsts following the loss.  No counseling.  Did not realize how much of her life was filled by caring for her ill daughter.  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  PCP:  Sinda Du, MD   Patient's last menstrual period was 07/07/2010.           Sexually active: Yes.   female The current method of family planning is post menopausal status.    Exercising: Yes.    Walks daily Smoker:  no  Health Maintenance: Pap:  06-15-15 Neg:Neg HR HPV History of abnormal Pap:  no MMG:  01-18-16 Density C/Neg/BiRads1:TBC Colonoscopy:  NEVER--pt. Unable to drink prep due to nausea BMD:  02-11-14  Result:normal:[facility] TDaP:  2011 Gardasil:   N/A Hep C:  Will do with her PCP. Screening Labs:  Hb today: PCP, Urine today:  neg   reports that she has never smoked. She has never used smokeless tobacco. She reports that she does not drink alcohol or use drugs.  Past Medical History:  Diagnosis Date  . Blood transfusion without reported diagnosis    age 54  . Fibroid   . Foraminal stenosis of cervical region 2016  . Hypertension   . Hypothyroidism   . Vitamin D deficiency     Past Surgical History:  Procedure Laterality Date  . CESAREAN SECTION  1989    Current Outpatient Prescriptions  Medication Sig Dispense Refill  . amLODipine (NORVASC) 5 MG tablet Take 5 mg by mouth daily.    Francia Greaves THYROID 30 MG tablet Take 1 tablet by mouth daily.    . bisoprolol-hydrochlorothiazide (ZIAC) 5-6.25 MG per tablet Take 1 tablet by mouth daily.    . Cholecalciferol (VITAMIN D3) 1000 units CAPS Take 1 capsule by mouth daily.    . furosemide (LASIX) 40 MG tablet Take 1 tablet by mouth as needed.    . Multiple Vitamin (MULTIVITAMIN) tablet Take 1 tablet by mouth daily.    Marland Kitchen TROKENDI XR 25 MG CP24 Take  1 tablet by mouth daily.     No current facility-administered medications for this visit.     Family History  Problem Relation Age of Onset  . Breast cancer Sister 29  . Hypertension Mother   . Thyroid disease Mother     thyroidectomy  . Hypertension Father   . Stroke Maternal Grandfather     ROS:  Pertinent items are noted in HPI.  Otherwise, a comprehensive ROS was negative.  Exam:   BP 138/76 (BP Location: Right Arm, Patient Position: Sitting, Cuff Size: Large)   Pulse 66   Resp 16   Ht 5' 7.5" (1.715 m)   Wt 218 lb 12.8 oz (99.2 kg)   LMP 07/07/2010   BMI 33.76 kg/m     General appearance: alert, cooperative and appears stated age Head: Normocephalic, without obvious abnormality, atraumatic Neck: no adenopathy, supple, symmetrical, trachea midline and thyroid normal to inspection and palpation Lungs: clear to auscultation bilaterally Breasts: normal appearance, no masses or tenderness, No nipple retraction or dimpling, No nipple discharge or bleeding, No axillary or supraclavicular adenopathy Heart: regular rate and rhythm Abdomen: soft, non-tender; no masses, no organomegaly Extremities: extremities normal, atraumatic, no cyanosis or edema Skin: Skin color, texture, turgor normal. No rashes or lesions Lymph nodes: Cervical, supraclavicular,  and axillary nodes normal. No abnormal inguinal nodes palpated Neurologic: Grossly normal  Pelvic: External genitalia:  no lesions              Urethra:  normal appearing urethra with no masses, tenderness or lesions              Bartholins and Skenes: normal                 Vagina: normal appearing vagina with normal color and discharge, no lesions              Cervix: no lesions              Pap taken: No. Bimanual Exam:  Uterus:  normal size, contour, position, consistency, mobility, non-tender              Adnexa: no mass, fullness, tenderness              Rectal exam: Yes.  .  Confirms.              Anus:  normal sphincter  tone, no lesions  Chaperone was present for exam.  Assessment:   Well woman visit with normal exam. FH of breast cancer.  Hypothyroidism.  Menopausal female.  Bereavement.  Plan: Mammogram screening discussed.  She will do in August 2018. Recommended self breast awareness. Pap and HR HPV as above. Guidelines for Calcium, Vitamin D, regular exercise program including cardiovascular and weight bearing exercise. Bereavement support given.   I am recommending family counseling through Hospice.  I gave her the phone number for the [facility].  Follow up annually and prn.      After visit summary provided.

## 2016-06-20 NOTE — Patient Instructions (Signed)

## 2016-07-08 ENCOUNTER — Ambulatory Visit: Payer: BC Managed Care – PPO | Admitting: Obstetrics and Gynecology

## 2016-12-05 ENCOUNTER — Other Ambulatory Visit: Payer: Self-pay | Admitting: Obstetrics and Gynecology

## 2016-12-05 DIAGNOSIS — Z1231 Encounter for screening mammogram for malignant neoplasm of breast: Secondary | ICD-10-CM

## 2016-12-13 DIAGNOSIS — I10 Essential (primary) hypertension: Secondary | ICD-10-CM | POA: Diagnosis not present

## 2016-12-13 DIAGNOSIS — E785 Hyperlipidemia, unspecified: Secondary | ICD-10-CM | POA: Diagnosis not present

## 2016-12-29 ENCOUNTER — Ambulatory Visit (INDEPENDENT_AMBULATORY_CARE_PROVIDER_SITE_OTHER): Payer: BC Managed Care – PPO | Admitting: Otolaryngology

## 2016-12-29 DIAGNOSIS — H6123 Impacted cerumen, bilateral: Secondary | ICD-10-CM | POA: Diagnosis not present

## 2017-01-19 ENCOUNTER — Ambulatory Visit
Admission: RE | Admit: 2017-01-19 | Discharge: 2017-01-19 | Disposition: A | Discharge status: Home / Self Care | Payer: 59 | Source: Ambulatory Visit | Attending: Obstetrics and Gynecology | Admitting: Obstetrics and Gynecology

## 2017-01-19 DIAGNOSIS — Z1231 Encounter for screening mammogram for malignant neoplasm of breast: Secondary | ICD-10-CM

## 2017-06-16 ENCOUNTER — Telehealth: Payer: Self-pay | Admitting: Obstetrics and Gynecology

## 2017-06-16 NOTE — Telephone Encounter (Signed)
Left message on voicemail to call and reschedule cancelled appointment. Patient added to wait list.

## 2017-06-23 ENCOUNTER — Ambulatory Visit: Payer: BC Managed Care – PPO | Admitting: Obstetrics and Gynecology

## 2017-07-17 ENCOUNTER — Ambulatory Visit (INDEPENDENT_AMBULATORY_CARE_PROVIDER_SITE_OTHER): Payer: 59 | Admitting: Obstetrics and Gynecology

## 2017-07-17 ENCOUNTER — Encounter: Payer: Self-pay | Admitting: Obstetrics and Gynecology

## 2017-07-17 ENCOUNTER — Other Ambulatory Visit: Payer: Self-pay

## 2017-07-17 VITALS — BP 134/76 | HR 66 | Resp 14 | Ht 67.0 in | Wt 230.0 lb

## 2017-07-17 DIAGNOSIS — R21 Rash and other nonspecific skin eruption: Secondary | ICD-10-CM

## 2017-07-17 DIAGNOSIS — Z01419 Encounter for gynecological examination (general) (routine) without abnormal findings: Secondary | ICD-10-CM

## 2017-07-17 DIAGNOSIS — L811 Chloasma: Secondary | ICD-10-CM

## 2017-07-17 NOTE — Patient Instructions (Signed)

## 2017-07-17 NOTE — Progress Notes (Signed)
57 y.o. [id number] Married AfricanAmerican female here for annual exam.    Skin itching on left breast.  Has rash. Not used any medications to treat this.  Washes with [employer].  No change in detergent.   Also has melasma.  Is doing skin bleaching.  Wants a second opinion.   Still with hot flashes but not bothered by them.  Taking Armour thyroid for hypothyroidism for 2 years.   PCP:   Dr. Luan Pulling - [facility].  Dr. Marquita Palms - Endocrinology.   Patient's last menstrual period was 07/07/2010.           Sexually active: No.  The current method of family planning is post menopausal status.    Exercising: Yes.    walking Smoker:  no  Health Maintenance: Pap:  06/15/15 negative, HR HPV negative  History of abnormal Pap:  no MMG:  01/19/17 density cat C/BIRADS 1 negative  Colonoscopy:  never BMD:   02/11/14  Result  Normal, [facility]  TDaP:  2011 Gardasil:   no Hep C: PCP Screening Labs:  PCP Hb today: PCP, Urine today: not collected   reports that  has never smoked. she has never used smokeless tobacco. She reports that she does not drink alcohol or use drugs.  Past Medical History:  Diagnosis Date  . Blood transfusion without reported diagnosis    age 38  . Fibroid   . Foraminal stenosis of cervical region 2016  . Hypertension   . Hypothyroidism   . Vitamin D deficiency     Past Surgical History:  Procedure Laterality Date  . CESAREAN SECTION  1989    Current Outpatient Medications  Medication Sig Dispense Refill  . amLODipine (NORVASC) 5 MG tablet Take 5 mg by mouth daily.    Francia Greaves THYROID 30 MG tablet Take 1 tablet by mouth daily.    . bisoprolol-hydrochlorothiazide (ZIAC) 5-6.25 MG per tablet Take 1 tablet by mouth daily.    . Cholecalciferol (VITAMIN D3) 1000 units CAPS Take 1 capsule by mouth daily.    . furosemide (LASIX) 40 MG tablet Take 1 tablet by mouth as needed.    . Multiple Vitamin (MULTIVITAMIN) tablet Take 1 tablet by mouth daily.     Marland Kitchen TROKENDI XR 25 MG CP24 Take 1 tablet by mouth daily.     No current facility-administered medications for this visit.     Family History  Problem Relation Age of Onset  . Breast cancer Sister 41  . Hypertension Mother   . Thyroid disease Mother        thyroidectomy  . Hypertension Father   . Stroke Maternal Grandfather     ROS:  Pertinent items are noted in HPI.  Otherwise, a comprehensive ROS was negative.  Exam:   BP 134/76 (BP Location: Right Arm, Patient Position: Sitting, Cuff Size: Large)   Pulse 66   Resp 14   Ht 5\' 7"  (1.702 m)   Wt 230 lb (104.3 kg)   LMP 07/07/2010   BMI 36.02 kg/m     General appearance: alert, cooperative and appears stated age Head: Normocephalic, without obvious abnormality, atraumatic Neck: no adenopathy, supple, symmetrical, trachea midline and thyroid normal to inspection and palpation Lungs: clear to auscultation bilaterally Breasts: right - normal appearance, no masses or tenderness, No nipple retraction or dimpling, No nipple discharge or bleeding, No axillary or supraclavicular adenopathy.  Left breast with skin rash - patches of thin skin with dark brown coloration at 9:00, No masses,  no retractions, nipple discharge, or axillary adenopathy.  Heart: regular rate and rhythm Abdomen: soft, non-tender; no masses, no organomegaly Extremities: extremities normal, atraumatic, no cyanosis or edema Skin: Face with melasma.  Lymph nodes: Cervical, supraclavicular, and axillary nodes normal. No abnormal inguinal nodes palpated Neurologic: Grossly normal  Pelvic: External genitalia:  no lesions              Urethra:  normal appearing urethra with no masses, tenderness or lesions              Bartholins and Skenes: normal                 Vagina: normal appearing vagina with normal color and discharge, no lesions              Cervix: no lesions              Pap taken: No. Bimanual Exam:  Uterus:  normal size, contour, position, consistency,  mobility, non-tender              Adnexa: no mass, fullness, tenderness              Rectal exam: Yes.  .  Confirms.              Anus:  normal sphincter tone, no lesions  Chaperone was present for exam.  Assessment:   Well woman visit with normal exam. FH of breast cancer.  Hypothyroidism.  Menopausal female.  Breast rash and pruritis.  Melasma.   Plan: Mammogram screening discussed. Recommended self breast awareness. Pap and HR HPV as above. Guidelines for Calcium, Vitamin D, regular exercise program including cardiovascular and weight bearing exercise. Labs with PCP.  Hydrocortisone cream 0.5% to breast prn.  Will refer to [facility].  Follow up annually and prn.   After visit summary provided.

## 2017-07-20 ENCOUNTER — Telehealth: Payer: Self-pay | Admitting: Obstetrics and Gynecology

## 2017-07-20 DIAGNOSIS — L299 Pruritus, unspecified: Secondary | ICD-10-CM

## 2017-07-20 DIAGNOSIS — R21 Rash and other nonspecific skin eruption: Secondary | ICD-10-CM

## 2017-07-20 NOTE — Telephone Encounter (Signed)
Please contact patient in follow up to her visit with me for her annual exam.  I have reviewed her care again, and I would like for her to have a diagnostic left mammogram and possible left breast ultrasound before she is seen by dermatology for her new onset left pruritis/skin changes.   I think this is most likely a skin condition, but we need to evaluate the tissue under the breast as well.   I did not feel any mass of examination.   Thank you.

## 2017-07-21 NOTE — Telephone Encounter (Signed)
Spoke with Jessica Mooney at [facility]. Patient scheduled for left breast Dx MMG and [place] if needed on 07/26/17 arriving at 7:50am for 8:10am appt.   Call returned to patient, advised of appointment details. Patient request MyChart message to include name of imaging and diagnoses to review with insurance provider prior to appt. Patient verbalizes understanding and is agreeable.   See MyChart message to patient with requested information.   Routing to provider for final review. Patient is agreeable to disposition. Will close encounter.

## 2017-07-21 NOTE — Telephone Encounter (Signed)
Left message to call Lawerance Matsuo at [phone].  

## 2017-07-21 NOTE — Telephone Encounter (Signed)
Spoke with patient, advised as seen below per Dr. Quincy Simmonds. Advised patient I will call [facility] to schedule imaging and return call, patient is agreeable with plan.

## 2017-07-26 ENCOUNTER — Other Ambulatory Visit: Payer: 59

## 2017-07-31 ENCOUNTER — Ambulatory Visit
Admission: RE | Admit: 2017-07-31 | Discharge: 2017-07-31 | Disposition: A | Discharge status: Home / Self Care | Payer: 59 | Source: Ambulatory Visit | Attending: Obstetrics and Gynecology | Admitting: Obstetrics and Gynecology

## 2017-07-31 ENCOUNTER — Ambulatory Visit: Payer: 59

## 2017-07-31 DIAGNOSIS — R21 Rash and other nonspecific skin eruption: Secondary | ICD-10-CM

## 2017-07-31 DIAGNOSIS — L299 Pruritus, unspecified: Secondary | ICD-10-CM

## 2017-07-31 DIAGNOSIS — R928 Other abnormal and inconclusive findings on diagnostic imaging of breast: Secondary | ICD-10-CM | POA: Diagnosis not present

## 2017-10-06 ENCOUNTER — Other Ambulatory Visit (HOSPITAL_COMMUNITY): Payer: Self-pay | Admitting: Pulmonary Disease

## 2017-10-06 ENCOUNTER — Ambulatory Visit (HOSPITAL_COMMUNITY)
Admission: RE | Admit: 2017-10-06 | Discharge: 2017-10-06 | Disposition: A | Discharge status: Home / Self Care | Payer: 59 | Source: Ambulatory Visit | Attending: Pulmonary Disease | Admitting: Pulmonary Disease

## 2017-10-06 DIAGNOSIS — M7989 Other specified soft tissue disorders: Secondary | ICD-10-CM | POA: Diagnosis not present

## 2017-10-06 DIAGNOSIS — M25571 Pain in right ankle and joints of right foot: Secondary | ICD-10-CM | POA: Diagnosis not present

## 2017-10-06 DIAGNOSIS — R52 Pain, unspecified: Secondary | ICD-10-CM

## 2017-10-06 DIAGNOSIS — S99921A Unspecified injury of right foot, initial encounter: Secondary | ICD-10-CM | POA: Diagnosis not present

## 2017-10-06 DIAGNOSIS — M79671 Pain in right foot: Secondary | ICD-10-CM | POA: Diagnosis not present

## 2017-10-06 DIAGNOSIS — S99911A Unspecified injury of right ankle, initial encounter: Secondary | ICD-10-CM | POA: Diagnosis not present

## 2017-10-11 DIAGNOSIS — I10 Essential (primary) hypertension: Secondary | ICD-10-CM | POA: Diagnosis not present

## 2017-10-11 DIAGNOSIS — E785 Hyperlipidemia, unspecified: Secondary | ICD-10-CM | POA: Diagnosis not present

## 2017-10-11 DIAGNOSIS — M79673 Pain in unspecified foot: Secondary | ICD-10-CM | POA: Diagnosis not present

## 2017-11-15 ENCOUNTER — Ambulatory Visit (HOSPITAL_COMMUNITY)
Admission: RE | Admit: 2017-11-15 | Discharge: 2017-11-15 | Disposition: A | Discharge status: Home / Self Care | Payer: 59 | Source: Ambulatory Visit | Attending: Internal Medicine | Admitting: Internal Medicine

## 2017-11-15 ENCOUNTER — Other Ambulatory Visit (HOSPITAL_COMMUNITY): Payer: Self-pay | Admitting: Internal Medicine

## 2017-11-15 DIAGNOSIS — M79605 Pain in left leg: Secondary | ICD-10-CM | POA: Diagnosis not present

## 2017-11-15 DIAGNOSIS — M769 Unspecified enthesopathy, lower limb, excluding foot: Secondary | ICD-10-CM | POA: Insufficient documentation

## 2017-11-15 DIAGNOSIS — M238X2 Other internal derangements of left knee: Secondary | ICD-10-CM | POA: Diagnosis not present

## 2017-11-15 DIAGNOSIS — S79912A Unspecified injury of left hip, initial encounter: Secondary | ICD-10-CM | POA: Diagnosis not present

## 2017-11-15 DIAGNOSIS — M25552 Pain in left hip: Secondary | ICD-10-CM | POA: Diagnosis not present

## 2017-11-16 ENCOUNTER — Emergency Department (HOSPITAL_COMMUNITY)
Admission: EM | Admit: 2017-11-16 | Discharge: 2017-11-16 | Disposition: A | Discharge status: Home / Self Care | Payer: 59 | Attending: Emergency Medicine | Admitting: Emergency Medicine

## 2017-11-16 ENCOUNTER — Encounter (HOSPITAL_COMMUNITY): Payer: Self-pay | Admitting: Emergency Medicine

## 2017-11-16 ENCOUNTER — Other Ambulatory Visit: Payer: Self-pay

## 2017-11-16 ENCOUNTER — Telehealth: Payer: Self-pay | Admitting: Orthopedic Surgery

## 2017-11-16 DIAGNOSIS — Z79899 Other long term (current) drug therapy: Secondary | ICD-10-CM | POA: Insufficient documentation

## 2017-11-16 DIAGNOSIS — S99912A Unspecified injury of left ankle, initial encounter: Secondary | ICD-10-CM | POA: Diagnosis present

## 2017-11-16 DIAGNOSIS — I10 Essential (primary) hypertension: Secondary | ICD-10-CM | POA: Insufficient documentation

## 2017-11-16 DIAGNOSIS — S93402A Sprain of unspecified ligament of left ankle, initial encounter: Secondary | ICD-10-CM | POA: Diagnosis not present

## 2017-11-16 DIAGNOSIS — X509XXA Other and unspecified overexertion or strenuous movements or postures, initial encounter: Secondary | ICD-10-CM | POA: Diagnosis not present

## 2017-11-16 DIAGNOSIS — E039 Hypothyroidism, unspecified: Secondary | ICD-10-CM | POA: Insufficient documentation

## 2017-11-16 DIAGNOSIS — Y999 Unspecified external cause status: Secondary | ICD-10-CM | POA: Diagnosis not present

## 2017-11-16 DIAGNOSIS — Y929 Unspecified place or not applicable: Secondary | ICD-10-CM | POA: Diagnosis not present

## 2017-11-16 DIAGNOSIS — Y9389 Activity, other specified: Secondary | ICD-10-CM | POA: Diagnosis not present

## 2017-11-16 MED ORDER — IBUPROFEN 600 MG PO TABS: 600.0000 mg | ORAL_TABLET | Freq: Four times a day (QID) | ORAL | 0 refills | Status: DC | PRN

## 2017-11-16 MED ORDER — IBUPROFEN 400 MG PO TABS
600.0000 mg | ORAL_TABLET | Freq: Once | ORAL | Status: AC
  Administered 2017-11-16: 600 mg via ORAL
  Filled 2017-11-16: qty 2

## 2017-11-16 MED ORDER — ACETAMINOPHEN 325 MG PO TABS: 650.0000 mg | ORAL_TABLET | Freq: Four times a day (QID) | ORAL | 0 refills | Status: DC | PRN

## 2017-11-16 NOTE — ED Provider Notes (Signed)
[facility] EMERGENCY DEPARTMENT Provider Note   CSN: [id number] Arrival date & time: 11/16/17  1340     History   Chief Complaint Chief Complaint  Patient presents with  . Ankle Pain    HPI Jessica Mooney is a 57 y.o. female with a past medical history of hypertension, who presents to ED for evaluation of left ankle pain since yesterday.  She was getting dressed when she stepped into her scar which caused her to twist her ankle.  She was seen by her PCP yesterday and had negative x-rays of the left ankle.  She states that she is still having pain worse with bearing weight on her left extremity.  She was told to come to the ED for orthopedic referral.  She contacted orthopedic office and states that she is not able to schedule an appointment until 5 days from now.  She is not taking any medication to help with her pain.  Denies any prior fracture, dislocations or procedures in the area.  She is wondering what she needs to do to go to work.  HPI  Past Medical History:  Diagnosis Date  . Blood transfusion without reported diagnosis    age 40  . Fibroid   . Foraminal stenosis of cervical region 2016  . Hypertension   . Hypothyroidism   . Vitamin D deficiency     Patient Active Problem List   Diagnosis Date Noted  . Foot fracture 04/04/2013    Past Surgical History:  Procedure Laterality Date  . CESAREAN SECTION  1989     OB History    Gravida  4   Para  2   Term  1   Preterm  0   AB  2   Living  1     SAB  0   TAB  0   Ectopic  0   Multiple  0   Live Births  1            Home Medications    Prior to Admission medications   Medication Sig Start Date End Date Taking? Authorizing Provider  acetaminophen (TYLENOL) 325 MG tablet Take 2 tablets (650 mg total) by mouth every 6 (six) hours as needed. 11/16/17   Mohmed Farver, PA-C  amLODipine (NORVASC) 5 MG tablet Take 5 mg by mouth daily.    [provider]  ARMOUR THYROID 30 MG tablet Take  1 tablet by mouth daily. 05/25/16   [provider]  bisoprolol-hydrochlorothiazide (ZIAC) 5-6.25 MG per tablet Take 1 tablet by mouth daily. 04/29/13   [provider]  Cholecalciferol (VITAMIN D3) 1000 units CAPS Take 1 capsule by mouth daily.    [provider]  furosemide (LASIX) 40 MG tablet Take 1 tablet by mouth as needed. 05/18/16   [provider]  ibuprofen (ADVIL,MOTRIN) 600 MG tablet Take 1 tablet (600 mg total) by mouth every 6 (six) hours as needed. 11/16/17   Daniella Dewberry, PA-C  Multiple Vitamin (MULTIVITAMIN) tablet Take 1 tablet by mouth daily.    [provider]  TROKENDI XR 25 MG CP24 Take 1 tablet by mouth daily. 05/21/15   [provider]    Family History Family History  Problem Relation Age of Onset  . Breast cancer Sister 27  . Hypertension Mother   . Thyroid disease Mother        thyroidectomy  . Hypertension Father   . Stroke Maternal Grandfather     Social History Social History  Tobacco Use  . Smoking status: Never Smoker  . Smokeless tobacco: Never Used  Substance Use Topics  . Alcohol use: No    Alcohol/week: 0.0 oz  . Drug use: No     Allergies   Sulfur   Review of Systems Review of Systems  Constitutional: Negative for chills and fever.  Musculoskeletal: Positive for arthralgias, gait problem and myalgias. Negative for joint swelling.  Neurological: Negative for weakness and numbness.     Physical Exam Updated Vital Signs BP (!) 153/94 (BP Location: Left Arm)   Pulse 69   Temp 98.6 F (37 C) (Oral)   Resp 18   Ht 5\' 7"  (1.702 m)   Wt 92.1 kg (203 lb)   LMP 07/07/2010   SpO2 98%   BMI 31.79 kg/m   Physical Exam  Constitutional: She appears well-developed and well-nourished. No distress.  HENT:  Head: Normocephalic and atraumatic.  Eyes: Conjunctivae and EOM are normal. No scleral icterus.  Neck: Normal range of motion.  Pulmonary/Chest: Effort normal. No respiratory  distress.  Musculoskeletal: Normal range of motion. She exhibits tenderness. She exhibits no deformity.  Tenderness to palpation of the left ankle.  Able to perform full active and passive range of motion of ankle without difficulty.  2+ DP pulse noted.  Sensation intact light touch.  Neurological: She is alert.  Skin: No rash noted. She is not diaphoretic.  Psychiatric: She has a normal mood and affect.  Nursing note and vitals reviewed.    ED Treatments / Results  Labs (all labs ordered are listed, but only abnormal results are displayed) Labs Reviewed - No data to display  EKG None  Radiology Dg Knee Complete 4 Views Left  Result Date: 11/15/2017 CLINICAL DATA:  Pain without trauma EXAM: LEFT KNEE - COMPLETE 4+ VIEW COMPARISON:  None. FINDINGS: Enthesopathic changes along the superior aspect of the patella. Minimal patellofemoral compartment degenerative changes. No fractures, dislocations, joint effusions, or other abnormalities. IMPRESSION: Minimal patellofemoral compartment degenerative changes. Enthesopathic changes associated with the superior patella. Electronically Signed   By: Dorise Bullion III M.D   On: 11/15/2017 23:32   Dg Hip Unilat With Pelvis 2-3 Views Left  Result Date: 11/15/2017 CLINICAL DATA:  Pain without trauma EXAM: DG HIP (WITH OR WITHOUT PELVIS) 2-3V LEFT COMPARISON:  None. FINDINGS: There is no evidence of hip fracture or dislocation. There is no evidence of arthropathy or other focal bone abnormality. IMPRESSION: Negative. Electronically Signed   By: Dorise Bullion III M.D   On: 11/15/2017 23:31    Procedures Procedures (including critical care time)  Medications Ordered in ED Medications  ibuprofen (ADVIL,MOTRIN) tablet 600 mg (has no administration in time range)     Initial Impression / Assessment and Plan / ED Course  I have reviewed the triage vital signs and the nursing notes.  Pertinent labs & imaging results that were available during my  care of the patient were reviewed by me and considered in my medical decision making (see chart for details).     57 year old female with a past medical history of hypertension presents for left ankle pain since yesterday.  She twisted her ankle while she was getting dressed at home.  Seen by her PCP yesterday, x-rays were done which were negative for fracture.  She still having pain with bearing weight.  She has not taken any medicine to help with her symptoms.  She already contacted the orthopedist office and was told she could make the next available appointment  in 5 days.  However, she states that she is still having pain is is unsure whether she can return to work.  On physical exam there is tenderness to palpation of the left ankle.  No changes to range of motion noted.  Area is neurovascularly intact.  She has not reinjured herself.  Patient states that she does not like taking any medicine especially not pain medicine.  I did convince her to alternate Tylenol and ibuprofen to help with swelling and pain so that she can slowly bear weight on the extremity as tolerated.  I encouraged her to keep the appointment with the orthopedist next week, rice therapy and returning to ED for any severe worsening symptoms.  Patient is agreeable to plan and is comfortable with discharge home.  Portions of this note were generated with [employer]. Dictation errors may occur despite best attempts at proofreading.   Final Clinical Impressions(s) / ED Diagnoses   Final diagnoses:  Sprain of left ankle, unspecified ligament, initial encounter    ED Discharge Orders        Ordered    ibuprofen (ADVIL,MOTRIN) 600 MG tablet  Every 6 hours PRN     11/16/17 1423    acetaminophen (TYLENOL) 325 MG tablet  Every 6 hours PRN     11/16/17 1423       Delia Heady, PA-C 11/16/17 1432    Virgel Manifold, MD 11/17/17 1410

## 2017-11-16 NOTE — Telephone Encounter (Signed)
Jessica Mooney called this afternoon stating that she is having a lot of leg pain.  She is a patient of Dr. Luan Pulling.  but said that Dr. Luan Pulling is out of town. She said that Dr. Willey Blade, who is covering for Dr. Luan Pulling sent her for xrays.  She said the xrays were negative for a fracture.  I told her that there wasn't a doctor in the office this afternoon.  I told her Dr. Aline Brochure was in the office tomorrow but he did not have any openings for a couple of weeks.  I told her that I could give her an appointment with Dr. Luna Glasgow this coming Tuesday.  She said she didn't know if she needed to go to the ER or not.  I told her that she could call me back if she decides to go ahead and schedule an appointment here.  She called back and asked to go ahead and schedule an appointment with Dr. Luna Glasgow for this coming Tuesday.  Appointment given for Tuesday, 11/21/17 @ 8:00

## 2017-11-16 NOTE — ED Triage Notes (Signed)
Patient c/o left ankle pain that radiates up leg. Per patient started after twisting leg stepping into a skirt. Patient had x-rays ordered by PCP office, told no fractures. Per patient still has pain with bearing weight on left leg. Patient reports told to come to ER by PCP for orthopedic referral. Patient called orthopedic offices around and no appointments available x1 month. Per patient was unable to go to work today due to unable to bear weight and states "I got to do something so I can go back to work."

## 2017-11-20 ENCOUNTER — Other Ambulatory Visit (HOSPITAL_COMMUNITY): Payer: Self-pay | Admitting: Pulmonary Disease

## 2017-11-20 ENCOUNTER — Ambulatory Visit (HOSPITAL_COMMUNITY)
Admission: RE | Admit: 2017-11-20 | Discharge: 2017-11-20 | Disposition: A | Discharge status: Home / Self Care | Payer: 59 | Source: Ambulatory Visit | Attending: Pulmonary Disease | Admitting: Pulmonary Disease

## 2017-11-20 DIAGNOSIS — I700 Atherosclerosis of aorta: Secondary | ICD-10-CM | POA: Diagnosis not present

## 2017-11-20 DIAGNOSIS — M545 Low back pain: Secondary | ICD-10-CM | POA: Diagnosis not present

## 2017-11-20 DIAGNOSIS — M5432 Sciatica, left side: Secondary | ICD-10-CM | POA: Diagnosis not present

## 2017-11-20 DIAGNOSIS — M5430 Sciatica, unspecified side: Secondary | ICD-10-CM

## 2017-11-20 DIAGNOSIS — M47816 Spondylosis without myelopathy or radiculopathy, lumbar region: Secondary | ICD-10-CM | POA: Diagnosis not present

## 2017-11-20 DIAGNOSIS — I10 Essential (primary) hypertension: Secondary | ICD-10-CM | POA: Diagnosis not present

## 2017-11-21 ENCOUNTER — Ambulatory Visit: Payer: 59 | Admitting: Orthopaedic Surgery

## 2017-12-06 ENCOUNTER — Other Ambulatory Visit: Payer: Self-pay | Admitting: Obstetrics and Gynecology

## 2017-12-06 DIAGNOSIS — Z1231 Encounter for screening mammogram for malignant neoplasm of breast: Secondary | ICD-10-CM

## 2017-12-15 ENCOUNTER — Other Ambulatory Visit (HOSPITAL_COMMUNITY): Payer: Self-pay | Admitting: Pulmonary Disease

## 2017-12-15 DIAGNOSIS — M545 Low back pain: Secondary | ICD-10-CM

## 2017-12-22 ENCOUNTER — Ambulatory Visit (HOSPITAL_COMMUNITY)
Admission: RE | Admit: 2017-12-22 | Discharge: 2017-12-22 | Disposition: A | Discharge status: Home / Self Care | Payer: 59 | Source: Ambulatory Visit | Attending: Pulmonary Disease | Admitting: Pulmonary Disease

## 2017-12-22 DIAGNOSIS — M545 Low back pain: Secondary | ICD-10-CM | POA: Diagnosis not present

## 2017-12-22 DIAGNOSIS — M4316 Spondylolisthesis, lumbar region: Secondary | ICD-10-CM | POA: Diagnosis not present

## 2017-12-22 DIAGNOSIS — M5126 Other intervertebral disc displacement, lumbar region: Secondary | ICD-10-CM | POA: Diagnosis not present

## 2017-12-22 DIAGNOSIS — M5136 Other intervertebral disc degeneration, lumbar region: Secondary | ICD-10-CM | POA: Insufficient documentation

## 2017-12-22 DIAGNOSIS — M5432 Sciatica, left side: Secondary | ICD-10-CM | POA: Diagnosis not present

## 2017-12-28 ENCOUNTER — Ambulatory Visit (INDEPENDENT_AMBULATORY_CARE_PROVIDER_SITE_OTHER): Payer: 59 | Admitting: Otolaryngology

## 2018-01-01 ENCOUNTER — Ambulatory Visit (INDEPENDENT_AMBULATORY_CARE_PROVIDER_SITE_OTHER): Payer: 59 | Admitting: Otolaryngology

## 2018-01-01 DIAGNOSIS — H6123 Impacted cerumen, bilateral: Secondary | ICD-10-CM

## 2018-01-03 ENCOUNTER — Encounter

## 2018-01-22 ENCOUNTER — Ambulatory Visit
Admission: RE | Admit: 2018-01-22 | Discharge: 2018-01-22 | Disposition: A | Discharge status: Home / Self Care | Payer: 59 | Source: Ambulatory Visit | Attending: Obstetrics and Gynecology | Admitting: Obstetrics and Gynecology

## 2018-01-22 DIAGNOSIS — Z1231 Encounter for screening mammogram for malignant neoplasm of breast: Secondary | ICD-10-CM

## 2018-02-15 DIAGNOSIS — Z6836 Body mass index (BMI) 36.0-36.9, adult: Secondary | ICD-10-CM | POA: Diagnosis not present

## 2018-02-15 DIAGNOSIS — M5126 Other intervertebral disc displacement, lumbar region: Secondary | ICD-10-CM | POA: Diagnosis not present

## 2018-02-15 DIAGNOSIS — I10 Essential (primary) hypertension: Secondary | ICD-10-CM | POA: Diagnosis not present

## 2018-02-27 DIAGNOSIS — E039 Hypothyroidism, unspecified: Secondary | ICD-10-CM | POA: Diagnosis not present

## 2018-02-27 DIAGNOSIS — R945 Abnormal results of liver function studies: Secondary | ICD-10-CM | POA: Diagnosis not present

## 2018-02-27 DIAGNOSIS — R944 Abnormal results of kidney function studies: Secondary | ICD-10-CM | POA: Diagnosis not present

## 2018-07-11 DIAGNOSIS — L811 Chloasma: Secondary | ICD-10-CM | POA: Diagnosis not present

## 2018-07-11 DIAGNOSIS — L253 Unspecified contact dermatitis due to other chemical products: Secondary | ICD-10-CM | POA: Insufficient documentation

## 2018-07-11 DIAGNOSIS — L240 Irritant contact dermatitis due to detergents: Secondary | ICD-10-CM | POA: Diagnosis not present

## 2018-07-18 DIAGNOSIS — M5416 Radiculopathy, lumbar region: Secondary | ICD-10-CM | POA: Diagnosis not present

## 2018-07-18 DIAGNOSIS — M79651 Pain in right thigh: Secondary | ICD-10-CM | POA: Diagnosis not present

## 2018-07-18 DIAGNOSIS — M5127 Other intervertebral disc displacement, lumbosacral region: Secondary | ICD-10-CM | POA: Diagnosis not present

## 2018-07-30 NOTE — Progress Notes (Deleted)
58 y.o. [id number] Married [place] American female here for annual exam.    PCP:     Patient's last menstrual period was 07/07/2010.           Sexually active: {yes no:314532}  The current method of family planning is post menopausal status.    Exercising: {yes no:314532}  {types:19826} Smoker:  no  Health Maintenance: Pap: 06-15-15 Neg:Neg HR HPV History of abnormal Pap:  no MMG: 01-22-18 3D Neg/density B/BiRads1 Colonoscopy: *** BMD:   02-11-14 Result : Normal TDaP: 2011 Gardasil:   no HIV: *** Hep C:*** Screening Labs:  Hb today: ***, Urine today: ***   reports that she has never smoked. She has never used smokeless tobacco. She reports that she does not drink alcohol or use drugs.  Past Medical History:  Diagnosis Date  . Blood transfusion without reported diagnosis    age 19  . Fibroid   . Foraminal stenosis of cervical region 2016  . Hypertension   . Hypothyroidism   . Vitamin D deficiency     Past Surgical History:  Procedure Laterality Date  . CESAREAN SECTION  1989    Current Outpatient Medications  Medication Sig Dispense Refill  . acetaminophen (TYLENOL) 325 MG tablet Take 2 tablets (650 mg total) by mouth every 6 (six) hours as needed. 30 tablet 0  . amLODipine (NORVASC) 5 MG tablet Take 5 mg by mouth daily.    Francia Greaves THYROID 30 MG tablet Take 1 tablet by mouth daily.    . bisoprolol-hydrochlorothiazide (ZIAC) 5-6.25 MG per tablet Take 1 tablet by mouth daily.    . Cholecalciferol (VITAMIN D3) 1000 units CAPS Take 1 capsule by mouth daily.    . furosemide (LASIX) 40 MG tablet Take 1 tablet by mouth as needed.    Marland Kitchen ibuprofen (ADVIL,MOTRIN) 600 MG tablet Take 1 tablet (600 mg total) by mouth every 6 (six) hours as needed. 30 tablet 0  . Multiple Vitamin (MULTIVITAMIN) tablet Take 1 tablet by mouth daily.    Marland Kitchen TROKENDI XR 25 MG CP24 Take 1 tablet by mouth daily.     No current facility-administered medications for this visit.     Family History  Problem  Relation Age of Onset  . Breast cancer Sister 75  . Hypertension Mother   . Thyroid disease Mother        thyroidectomy  . Hypertension Father   . Stroke Maternal Grandfather     Review of Systems  Exam:   LMP 07/07/2010     General appearance: alert, cooperative and appears stated age Head: Normocephalic, without obvious abnormality, atraumatic Neck: no adenopathy, supple, symmetrical, trachea midline and thyroid normal to inspection and palpation Lungs: clear to auscultation bilaterally Breasts: normal appearance, no masses or tenderness, No nipple retraction or dimpling, No nipple discharge or bleeding, No axillary or supraclavicular adenopathy Heart: regular rate and rhythm Abdomen: soft, non-tender; no masses, no organomegaly Extremities: extremities normal, atraumatic, no cyanosis or edema Skin: Skin color, texture, turgor normal. No rashes or lesions Lymph nodes: Cervical, supraclavicular, and axillary nodes normal. No abnormal inguinal nodes palpated Neurologic: Grossly normal  Pelvic: External genitalia:  no lesions              Urethra:  normal appearing urethra with no masses, tenderness or lesions              Bartholins and Skenes: normal                 Vagina: normal  appearing vagina with normal color and discharge, no lesions              Cervix: no lesions              Pap taken: {yes no:314532} Bimanual Exam:  Uterus:  normal size, contour, position, consistency, mobility, non-tender              Adnexa: no mass, fullness, tenderness              Rectal exam: {yes no:314532}.  Confirms.              Anus:  normal sphincter tone, no lesions  Chaperone was present for exam.  Assessment:   Well woman visit with normal exam.   Plan: Mammogram screening. Recommended self breast awareness. Pap and HR HPV as above. Guidelines for Calcium, Vitamin D, regular exercise program including cardiovascular and weight bearing exercise.   Follow up annually and prn.    Additional counseling given.  {yes [id number]. _______ minutes face to face time of which over 50% was spent in counseling.    After visit summary provided.

## 2018-08-01 ENCOUNTER — Ambulatory Visit: Payer: 59 | Admitting: Obstetrics and Gynecology

## 2018-08-03 ENCOUNTER — Ambulatory Visit: Payer: 59 | Admitting: Obstetrics and Gynecology

## 2018-08-03 ENCOUNTER — Encounter: Payer: Self-pay | Admitting: Obstetrics and Gynecology

## 2018-08-03 ENCOUNTER — Other Ambulatory Visit (HOSPITAL_COMMUNITY)
Admission: RE | Admit: 2018-08-03 | Discharge: 2018-08-03 | Disposition: A | Discharge status: Home / Self Care | Payer: 59 | Source: Ambulatory Visit | Attending: Obstetrics and Gynecology | Admitting: Obstetrics and Gynecology

## 2018-08-03 ENCOUNTER — Other Ambulatory Visit: Payer: Self-pay

## 2018-08-03 VITALS — BP 122/70 | HR 76 | Resp 16 | Ht 67.25 in | Wt 225.0 lb

## 2018-08-03 DIAGNOSIS — Z01419 Encounter for gynecological examination (general) (routine) without abnormal findings: Secondary | ICD-10-CM | POA: Diagnosis present

## 2018-08-03 NOTE — Patient Instructions (Signed)

## 2018-08-03 NOTE — Progress Notes (Signed)
58 y.o. [id number] Married [place] American female here for annual exam.    Still with hot flashes.   Gaining weight.   Thyroid function is normal currently with Armor thyroid.   Not sexually active due to husband's health issues.  She does have dryness.   Saw dermatology for breast rash and pruritis.  Dx with probable eczema.   Has leg pain related to spine issues.   PCP: Dr. Sinda Du    Patient's last menstrual period was 07/07/2010.           Sexually active: Yes.    The current method of family planning is post menopausal status.    Exercising: Yes.    walking Smoker:  no  Health Maintenance: Pap:  06/15/15 negative, HR HPV negative  History of abnormal Pap:  no MMG:  01/22/18 BIRADS 1 negative/density b Colonoscopy:  Never.. She may do Cologuard with her PCP.  BMD:   02/11/14  Result  Normal TDaP:  2011 Gardasil:   n/a Hep C: PCP Screening Labs: PCP Flu vaccine:  Not done.    reports that she has never smoked. She has never used smokeless tobacco. She reports that she does not drink alcohol or use drugs.  Past Medical History:  Diagnosis Date  . Blood transfusion without reported diagnosis    age 6  . Fibroid   . Foraminal stenosis of cervical region 2016  . Hypertension   . Hypothyroidism   . Vitamin D deficiency     Past Surgical History:  Procedure Laterality Date  . CESAREAN SECTION  1989    Current Outpatient Medications  Medication Sig Dispense Refill  . amLODipine (NORVASC) 5 MG tablet Take 5 mg by mouth daily.    Francia Greaves THYROID 30 MG tablet Take 1 tablet by mouth daily.    . bisoprolol-hydrochlorothiazide (ZIAC) 5-6.25 MG per tablet Take 1 tablet by mouth daily.    . Cholecalciferol (VITAMIN D3) 1000 units CAPS Take 1 capsule by mouth daily.    . furosemide (LASIX) 40 MG tablet Take 1 tablet by mouth as needed.    . Multiple Vitamin (MULTIVITAMIN) tablet Take 1 tablet by mouth daily.    Marland Kitchen TROKENDI XR 25 MG CP24 Take 1 tablet by mouth  daily.     No current facility-administered medications for this visit.     Family History  Problem Relation Age of Onset  . Breast cancer Sister 74  . Hypertension Mother   . Thyroid disease Mother        thyroidectomy  . Hypertension Father   . Stroke Maternal Grandfather     Review of Systems  Constitutional: Negative.   HENT: Negative.   Eyes: Negative.   Respiratory: Negative.   Cardiovascular: Negative.   Gastrointestinal: Negative.   Endocrine: Negative.   Genitourinary: Negative.   Musculoskeletal: Negative.   Skin: Negative.   Allergic/Immunologic: Negative.   Neurological: Negative.   Hematological: Negative.   Psychiatric/Behavioral: Negative.     Exam:   BP 122/70 (BP Location: Right Arm, Patient Position: Sitting, Cuff Size: Large)   Pulse 76   Resp 16   Ht 5' 7.25" (1.708 m)   Wt 225 lb (102.1 kg)   LMP 07/07/2010   BMI 34.98 kg/m     General appearance: alert, cooperative and appears stated age Head: Normocephalic, without obvious abnormality, atraumatic Neck: no adenopathy, supple, symmetrical, trachea midline and thyroid normal to inspection and palpation Lungs: clear to auscultation bilaterally Breasts: normal appearance, no masses or  tenderness, No nipple retraction or dimpling, No nipple discharge or bleeding, No axillary or supraclavicular adenopathy Heart: regular rate and rhythm Abdomen: soft, non-tender; no masses, no organomegaly Extremities: extremities normal, atraumatic, no cyanosis or edema Skin: Skin color, texture, turgor normal. No rashes or lesions Lymph nodes: Cervical, supraclavicular, and axillary nodes normal. No abnormal inguinal nodes palpated Neurologic: Grossly normal  Pelvic: External genitalia:  no lesions              Urethra:  normal appearing urethra with no masses, tenderness or lesions              Bartholins and Skenes: normal                 Vagina: normal appearing vagina with normal color and discharge, no  lesions.  Mild atrophy noted.              Cervix: no lesions              Pap taken: Yes.   Bimanual Exam:  Uterus:  normal size, contour, position, consistency, mobility, non-tender              Adnexa: no mass, fullness, tenderness              Rectal exam: Yes.  .  Confirms.              Anus:  normal sphincter tone, no lesions  Chaperone was present for exam.  Assessment:   Well woman visit with normal exam. FH of breast cancer.  Hypothyroidism.  Menopausal female. Breast rash and pruritis.  Eczema.  Melasma.   Plan: Mammogram screening. Recommended self breast awareness. Pap and HR HPV as above. Guidelines for Calcium, Vitamin D, regular exercise program including cardiovascular and weight bearing exercise. We discussed water based and oil based lubricants.  Follow up annually and prn.   After visit summary provided.

## 2018-08-06 LAB — CYTOLOGY - PAP
DIAGNOSIS: NEGATIVE
HPV: NOT DETECTED

## 2018-08-13 DIAGNOSIS — R51 Headache: Secondary | ICD-10-CM | POA: Diagnosis not present

## 2018-08-13 DIAGNOSIS — I10 Essential (primary) hypertension: Secondary | ICD-10-CM | POA: Diagnosis not present

## 2018-08-14 ENCOUNTER — Other Ambulatory Visit: Payer: Self-pay | Admitting: Pulmonary Disease

## 2018-08-14 ENCOUNTER — Ambulatory Visit (HOSPITAL_COMMUNITY)
Admission: RE | Admit: 2018-08-14 | Discharge: 2018-08-14 | Disposition: A | Discharge status: Home / Self Care | Payer: 59 | Source: Ambulatory Visit | Attending: Pulmonary Disease | Admitting: Pulmonary Disease

## 2018-08-14 DIAGNOSIS — H538 Other visual disturbances: Secondary | ICD-10-CM

## 2018-08-14 DIAGNOSIS — R51 Headache: Secondary | ICD-10-CM | POA: Diagnosis not present

## 2018-08-20 ENCOUNTER — Ambulatory Visit (INDEPENDENT_AMBULATORY_CARE_PROVIDER_SITE_OTHER): Payer: 59 | Admitting: Otolaryngology

## 2018-08-20 DIAGNOSIS — H6123 Impacted cerumen, bilateral: Secondary | ICD-10-CM | POA: Diagnosis not present

## 2018-08-20 DIAGNOSIS — R51 Headache: Secondary | ICD-10-CM | POA: Diagnosis not present

## 2018-10-05 DIAGNOSIS — Z91018 Allergy to other foods: Secondary | ICD-10-CM

## 2018-10-05 HISTORY — DX: Allergy to other foods: Z91.018

## 2018-10-08 DIAGNOSIS — W57XXXA Bitten or stung by nonvenomous insect and other nonvenomous arthropods, initial encounter: Secondary | ICD-10-CM | POA: Diagnosis not present

## 2018-10-08 DIAGNOSIS — L03119 Cellulitis of unspecified part of limb: Secondary | ICD-10-CM | POA: Diagnosis not present

## 2018-10-11 DIAGNOSIS — Z0000 Encounter for general adult medical examination without abnormal findings: Secondary | ICD-10-CM | POA: Diagnosis not present

## 2018-10-15 DIAGNOSIS — I10 Essential (primary) hypertension: Secondary | ICD-10-CM | POA: Diagnosis not present

## 2018-10-15 DIAGNOSIS — E785 Hyperlipidemia, unspecified: Secondary | ICD-10-CM | POA: Diagnosis not present

## 2018-10-15 DIAGNOSIS — E669 Obesity, unspecified: Secondary | ICD-10-CM | POA: Diagnosis not present

## 2018-12-10 ENCOUNTER — Other Ambulatory Visit: Payer: Self-pay | Admitting: Obstetrics and Gynecology

## 2018-12-10 DIAGNOSIS — Z1231 Encounter for screening mammogram for malignant neoplasm of breast: Secondary | ICD-10-CM

## 2019-01-25 ENCOUNTER — Other Ambulatory Visit: Payer: Self-pay

## 2019-01-25 ENCOUNTER — Ambulatory Visit
Admission: RE | Admit: 2019-01-25 | Discharge: 2019-01-25 | Disposition: A | Discharge status: Home / Self Care | Payer: 59 | Source: Ambulatory Visit | Attending: Obstetrics and Gynecology | Admitting: Obstetrics and Gynecology

## 2019-01-25 DIAGNOSIS — Z1231 Encounter for screening mammogram for malignant neoplasm of breast: Secondary | ICD-10-CM

## 2019-01-31 ENCOUNTER — Ambulatory Visit (INDEPENDENT_AMBULATORY_CARE_PROVIDER_SITE_OTHER): Payer: 59 | Admitting: Otolaryngology

## 2019-02-27 LAB — LIPID PANEL
Cholesterol: 216 — AB (ref 0–200)
HDL: 39 (ref 35–70)
LDL Cholesterol: 142
LDl/HDL Ratio: 5.5
Triglycerides: 201 — AB (ref 40–160)

## 2019-02-27 LAB — COMPREHENSIVE METABOLIC PANEL
Albumin: 4.3 (ref 3.5–5.0)
Calcium: 9.7 (ref 8.7–10.7)
GFR calc Af Amer: 70
GFR calc non Af Amer: 61
Globulin: 3.1

## 2019-02-27 LAB — BASIC METABOLIC PANEL
BUN: 12 (ref 4–21)
CO2: 27 — AB (ref 13–22)
Chloride: 104 (ref 99–108)
Creatinine: 1 (ref <=1.1)
Glucose: 90
Potassium: 3.9 (ref 3.4–5.3)
Sodium: 142 (ref 137–147)

## 2019-02-27 LAB — TSH: TSH: 2.38 (ref <=5.90)

## 2019-02-27 LAB — HEPATIC FUNCTION PANEL
ALT: 7 (ref 7–35)
AST: 14 (ref 13–35)
Alkaline Phosphatase: 62 (ref 25–125)
Bilirubin, Total: 0.5

## 2019-02-27 LAB — CBC AND DIFFERENTIAL
HCT: 42 (ref 36–46)
Hemoglobin: 14 (ref 12.0–16.0)
Platelets: 343 (ref 150–399)
WBC: 7.4

## 2019-02-27 LAB — CBC: RBC: 4.69 (ref 3.87–5.11)

## 2019-04-24 ENCOUNTER — Other Ambulatory Visit: Payer: Self-pay

## 2019-04-24 ENCOUNTER — Ambulatory Visit: Payer: 59 | Admitting: Family Medicine

## 2019-04-24 ENCOUNTER — Encounter: Payer: Self-pay | Admitting: Family Medicine

## 2019-04-24 VITALS — BP 131/89 | HR 68 | Temp 98.4°F | Ht 67.5 in | Wt 232.4 lb

## 2019-04-24 DIAGNOSIS — R3129 Other microscopic hematuria: Secondary | ICD-10-CM

## 2019-04-24 DIAGNOSIS — E785 Hyperlipidemia, unspecified: Secondary | ICD-10-CM | POA: Insufficient documentation

## 2019-04-24 DIAGNOSIS — E039 Hypothyroidism, unspecified: Secondary | ICD-10-CM | POA: Diagnosis not present

## 2019-04-24 DIAGNOSIS — I10 Essential (primary) hypertension: Secondary | ICD-10-CM | POA: Insufficient documentation

## 2019-04-24 LAB — POCT URINALYSIS DIP (CLINITEK)
Bilirubin, UA: NEGATIVE
Glucose, UA: NEGATIVE mg/dL
Ketones, POC UA: NEGATIVE mg/dL
Leukocytes, UA: NEGATIVE
Nitrite, UA: NEGATIVE
POC PROTEIN,UA: NEGATIVE
Spec Grav, UA: 1.015 (ref 1.010–1.025)
Urobilinogen, UA: 0.2 E.U./dL
pH, UA: 5 (ref 5.0–8.0)

## 2019-04-24 NOTE — Patient Instructions (Addendum)
Lasix-take in the morning Bisoprolol/HCTZ -take in the morning Amlodipine-take at night Take thyroid medication away from other medications and food Hold on Rosuvastatin -recheck lipid panel  Call if cologuard needs to be ordered   Managing Your Hypertension Hypertension is commonly called high blood pressure. This is when the force of your blood pressing against the walls of your arteries is too strong. Arteries are blood vessels that carry blood from your heart throughout your body. Hypertension forces the heart to work harder to pump blood, and may cause the arteries to become narrow or stiff. Having untreated or uncontrolled hypertension can cause heart attack, stroke, kidney disease, and other problems. What are blood pressure readings? A blood pressure reading consists of a higher number over a lower number. Ideally, your blood pressure should be below 120/80. The first ("top") number is called the systolic pressure. It is a measure of the pressure in your arteries as your heart beats. The second ("bottom") number is called the diastolic pressure. It is a measure of the pressure in your arteries as the heart relaxes. What does my blood pressure reading mean? Blood pressure is classified into four stages. Based on your blood pressure reading, your health care provider may use the following stages to determine what type of treatment you need, if any. Systolic pressure and diastolic pressure are measured in a unit called mm Hg. Normal  Systolic pressure: below 123456.  Diastolic pressure: below 80. Elevated  Systolic pressure: [id number].  Diastolic pressure: below 80. Hypertension stage 1  Systolic pressure: 0000000.  Diastolic pressure: [id number]. Hypertension stage 2  Systolic pressure: [id number] or above.  Diastolic pressure: 90 or above. What health risks are associated with hypertension? Managing your hypertension is an important responsibility. Uncontrolled hypertension can lead to:  A  heart attack.  A stroke.  A weakened blood vessel (aneurysm).  Heart failure.  Kidney damage.  Eye damage.  Metabolic syndrome.  Memory and concentration problems. What changes can I make to manage my hypertension? Hypertension can be managed by making lifestyle changes and possibly by taking medicines. Your health care provider will help you make a plan to bring your blood pressure within a normal range. Eating and drinking   Eat a diet that is high in fiber and potassium, and low in salt (sodium), added sugar, and fat. An example eating plan is called the DASH (Dietary Approaches to Stop Hypertension) diet. To eat this way: ? Eat plenty of fresh fruits and vegetables. Try to fill half of your plate at each meal with fruits and vegetables. ? Eat whole grains, such as whole wheat pasta, brown rice, or whole grain bread. Fill about one quarter of your plate with whole grains. ? Eat low-fat diary products. ? Avoid fatty cuts of meat, processed or cured meats, and poultry with skin. Fill about one quarter of your plate with lean proteins such as fish, chicken without skin, beans, eggs, and tofu. ? Avoid premade and processed foods. These tend to be higher in sodium, added sugar, and fat.  Reduce your daily sodium intake. Most people with hypertension should eat less than 1,500 mg of sodium a day.  Limit alcohol intake to no more than 1 drink a day for nonpregnant women and 2 drinks a day for men. One drink equals 12 oz of beer, 5 oz of wine, or 1 oz of hard liquor. Lifestyle  Work with your health care provider to maintain a healthy body weight, or to lose weight. Ask what an  ideal weight is for you.  Get at least 30 minutes of exercise that causes your heart to beat faster (aerobic exercise) most days of the week. Activities may include walking, swimming, or biking.  Include exercise to strengthen your muscles (resistance exercise), such as weight lifting, as part of your weekly  exercise routine. Try to do these types of exercises for 30 minutes at least 3 days a week.  Do not use any products that contain nicotine or tobacco, such as cigarettes and e-cigarettes. If you need help quitting, ask your health care provider.  Control any long-term (chronic) conditions you have, such as high cholesterol or diabetes. Monitoring  Monitor your blood pressure at home as told by your health care provider. Your personal target blood pressure may vary depending on your medical conditions, your age, and other factors.  Have your blood pressure checked regularly, as often as told by your health care provider. Working with your health care provider  Review all the medicines you take with your health care provider because there may be side effects or interactions.  Talk with your health care provider about your diet, exercise habits, and other lifestyle factors that may be contributing to hypertension.  Visit your health care provider regularly. Your health care provider can help you create and adjust your plan for managing hypertension. Will I need medicine to control my blood pressure? Your health care provider may prescribe medicine if lifestyle changes are not enough to get your blood pressure under control, and if:  Your systolic blood pressure is 130 or higher.  Your diastolic blood pressure is 80 or higher. Take medicines only as told by your health care provider. Follow the directions carefully. Blood pressure medicines must be taken as prescribed. The medicine does not work as well when you skip doses. Skipping doses also puts you at risk for problems. Contact a health care provider if:  You think you are having a reaction to medicines you have taken.  You have repeated (recurrent) headaches.  You feel dizzy.  You have swelling in your ankles.  You have trouble with your vision. Get help right away if:  You develop a severe headache or confusion.  You have  unusual weakness or numbness, or you feel faint.  You have severe pain in your chest or abdomen.  You vomit repeatedly.  You have trouble breathing. Summary  Hypertension is when the force of blood pumping through your arteries is too strong. If this condition is not controlled, it may put you at risk for serious complications.  Your personal target blood pressure may vary depending on your medical conditions, your age, and other factors. For most people, a normal blood pressure is less than 120/80.  Hypertension is managed by lifestyle changes, medicines, or both. Lifestyle changes include weight loss, eating a healthy, low-sodium diet, exercising more, and limiting alcohol. This information is not intended to replace advice given to you by your health care provider. Make sure you discuss any questions you have with your health care provider. Document Released: 02/15/2012 Document Revised: 09/14/2018 Document Reviewed: 04/20/2016 Elsevier Patient Education  2020 [employer].

## 2019-04-24 NOTE — Progress Notes (Signed)
New Patient Office Visit  Subjective:  Patient ID: Jessica Mooney, female    DOB: [birth year]  Age: 58 y.o. MRN: [id number]  CC:  Chief Complaint  Patient presents with  . Establish Care  Hyperlipidemia/hypothyroid/HTN-chronic disease management  HPI Jessica Mooney presents for HTN-amlodipine, bisoprolol/HCTZ, lasix-stable bp readings, no headaches or visual changes Hyperlipdemia-pt did not start Crestor for elevated TC and LDL-diet modification selected for ongoing treatment Vertigo-ENT following Hypothyroid-amour thyroid daily -TSH 2.38-stable Past Medical History:  Diagnosis Date  . Blood transfusion without reported diagnosis    age 8  . Fibroid   . Foraminal stenosis of cervical region 2016  . Hypertension   . Hypothyroidism   . Vitamin D deficiency     Past Surgical History:  Procedure Laterality Date  . CESAREAN SECTION  1989    Family History  Problem Relation Age of Onset  . Breast cancer Sister 71  . Hypertension Mother   . Thyroid disease Mother        thyroidectomy  . Hypertension Father   . Stroke Maternal Grandfather     Social History   Socioeconomic History  . Marital status: Married    Spouse name: Not on file  . Number of children: Not on file  . Years of education: Not on file  . Highest education level: Not on file  Occupational History  . Not on file  Social Needs  . Financial resource strain: Not on file  . Food insecurity    Worry: Not on file    Inability: Not on file  . Transportation needs    Medical: Not on file    Non-medical: Not on file  Tobacco Use  . Smoking status: Never Smoker  . Smokeless tobacco: Never Used  Substance and Sexual Activity  . Alcohol use: No    Alcohol/week: 0.0 standard drinks  . Drug use: No  . Sexual activity: Yes    Partners: Male    Birth control/protection: Post-menopausal  Lifestyle  . Physical activity    Days per week: Not on file    Minutes per session: Not on file  . Stress:  Not on file  Relationships  . Social Herbalist on phone: Not on file    Gets together: Not on file    Attends religious service: Not on file    Active member of club or organization: Not on file    Attends meetings of clubs or organizations: Not on file    Relationship status: Not on file  . Intimate partner violence    Fear of current or ex partner: Not on file    Emotionally abused: Not on file    Physically abused: Not on file    Forced sexual activity: Not on file  Other Topics Concern  . Not on file  Social History Narrative  . Not on file    ROS Review of Systems  Constitutional: Negative.   HENT: Negative.   Eyes: Negative.   Respiratory: Negative.   Cardiovascular: Negative.   Gastrointestinal: Negative.   Endocrine: Negative.   Genitourinary: Negative.        GYN exam 2020  Musculoskeletal: Negative.   Allergic/Immunologic: Negative.   Neurological: Negative.   Hematological: Negative.   Psychiatric/Behavioral: Negative.     Objective:   Today's Vitals: BP 131/89   Pulse 68   Temp 98.4 F (36.9 C) (Oral)   Ht 5' 7.5" (1.715 m)   Wt 232 lb 6.4 oz (  105.4 kg)   LMP 07/07/2010   SpO2 98%   BMI 35.86 kg/m   Physical Exam Vitals signs reviewed.  Constitutional:      Appearance: Normal appearance.  HENT:     Head: Normocephalic and atraumatic.     Right Ear: Tympanic membrane, ear canal and external ear normal.     Left Ear: Tympanic membrane, ear canal and external ear normal.     Nose: Nose normal.  Eyes:     Conjunctiva/sclera: Conjunctivae normal.  Neck:     Musculoskeletal: Normal range of motion and neck supple.  Cardiovascular:     Pulses: Normal pulses.     Heart sounds: Normal heart sounds.  Pulmonary:     Effort: Pulmonary effort is normal.     Breath sounds: Normal breath sounds.  Neurological:     General: No focal deficit present.     Mental Status: She is alert and oriented to person, place, and time.     Deep Tendon  Reflexes: Reflexes normal.  Psychiatric:        Mood and Affect: Mood normal.        Behavior: Behavior normal.     Assessment & Plan:    Outpatient Encounter Medications as of 04/24/2019  Medication Sig  . rosuvastatin (CRESTOR) 10 MG tablet Take 10 mg by mouth daily.  Marland Kitchen amLODipine (NORVASC) 5 MG tablet Take 5 mg by mouth daily.  Francia Greaves THYROID 30 MG tablet Take 1 tablet by mouth daily.  . bisoprolol-hydrochlorothiazide (ZIAC) 5-6.25 MG per tablet Take 1 tablet by mouth daily.  . Cholecalciferol (VITAMIN D3) 1000 units CAPS Take 1 capsule by mouth daily.  . furosemide (LASIX) 40 MG tablet Take 1 tablet by mouth as needed.  . Multiple Vitamin (MULTIVITAMIN) tablet Take 1 tablet by mouth daily.  . Multiple Vitamin (ONE DAILY) tablet Take by mouth.  . TROKENDI XR 25 MG CP24 Take 1 tablet by mouth daily.   No facility-administered encounter medications on file as of 04/24/2019.   1. Hypertension, unspecified type Amlodipine/bisoprolol/HCTZ, lasix-stable Check urine today 2. Hypothyroidism, unspecified type Armour thyroid-TSH normal  3. Hyperlipidemia, unspecified hyperlipidemia type Pt did not start Crestor-holding medication for diet management, recheck in 6 months  D/w pt cologuard for colon cancer screening and influenza vaccine-pt declined Follow-up: 6 months  Calin Ellery Hannah Beat, MD

## 2019-04-25 LAB — URINALYSIS, ROUTINE W REFLEX MICROSCOPIC
Bilirubin Urine: NEGATIVE
Glucose, UA: NEGATIVE
Hgb urine dipstick: NEGATIVE
Ketones, ur: NEGATIVE
Leukocytes,Ua: NEGATIVE
Nitrite: NEGATIVE
Protein, ur: NEGATIVE
Specific Gravity, Urine: 1.009 (ref 1.001–1.03)
pH: <=5 (ref 5.0–8.0)

## 2019-04-27 DIAGNOSIS — R3129 Other microscopic hematuria: Secondary | ICD-10-CM | POA: Insufficient documentation

## 2019-05-14 ENCOUNTER — Telehealth: Payer: Self-pay | Admitting: Family Medicine

## 2019-05-14 NOTE — Telephone Encounter (Signed)
Patient is calling and states at her appointment she spoke with Dr. 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 Patient is requesting that referral.

## 2019-05-14 NOTE — Telephone Encounter (Signed)
Routing to Dr. Corum for Advice? 

## 2019-05-15 NOTE — Telephone Encounter (Signed)
Patient is aware 

## 2019-05-15 NOTE — Telephone Encounter (Signed)
Referral made Medications sent to pharmacy

## 2019-05-16 ENCOUNTER — Other Ambulatory Visit: Payer: Self-pay | Admitting: Family Medicine

## 2019-05-16 DIAGNOSIS — Z91018 Allergy to other foods: Secondary | ICD-10-CM

## 2019-06-12 ENCOUNTER — Other Ambulatory Visit: Payer: Self-pay | Admitting: Family Medicine

## 2019-06-20 ENCOUNTER — Ambulatory Visit: Payer: 59 | Admitting: Allergy

## 2019-07-31 ENCOUNTER — Other Ambulatory Visit: Payer: Self-pay

## 2019-07-31 ENCOUNTER — Encounter: Payer: Self-pay | Admitting: Allergy

## 2019-07-31 ENCOUNTER — Ambulatory Visit: Payer: 59 | Admitting: Allergy

## 2019-07-31 VITALS — BP 142/100 | HR 62 | Temp 97.7°F | Resp 18 | Ht 67.0 in | Wt 227.8 lb

## 2019-07-31 DIAGNOSIS — T7800XA Anaphylactic reaction due to unspecified food, initial encounter: Secondary | ICD-10-CM

## 2019-07-31 DIAGNOSIS — Z91018 Allergy to other foods: Secondary | ICD-10-CM | POA: Diagnosis not present

## 2019-07-31 MED ORDER — EPINEPHRINE 0.3 MG/0.3ML IJ SOAJ: 0.3000 mg | Freq: Once | INTRAMUSCULAR | 1 refills | Status: AC

## 2019-07-31 NOTE — Patient Instructions (Signed)
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     Dairy and gelatin are by-products of red meat animals that can also lead to symptoms with ingestion related to alpha gal allergy.   You also need to be cautious with certain vaccines that can continue pork/gelatin products.    - have access to self-injectable epinephrine (Epipen or AuviQ) 0.3mg  at all times  - follow emergency action plan in case of allergic reaction  - obtain repeat alpha gal allergy panel after Oct 08, 2018 for yearly trend  Follow-up 1 year or sooner if needed    Alpha-gal and Red Meat Allergy   Overview An allergy to "alpha-gal" refers to having a severe and potentially life-threatening allergy to a carbohydrate molecule called galactose-alpha-1,3-galactose that is found in most mammalian or "red meat". Unlike other food allergies which typically occur within minutes of ingestion, symptoms from eating red meat such as pork, lamb or beef may be delayed, occurring 3-8 hours after eating. Most food allergies are directed against a protein molecule, but alpha-gal is unusual because it is a carbohydrate, and a delay in its absorption may explain the delay in symptoms.  What are the symptoms of an alpha-gal allergy? As with other food allergies, signs or symptoms of an allergy to alpha-gal may include: . Hives and itching  . Swelling of your lips, face or eyelids  . Shortness of breath, cough or wheezing  . Abdominal pain, nausea, diarrhea or vomiting The most severe reaction, anaphylaxis, can present as a combination of several of these symptoms, may include low blood pressure, and is potentially fatal.  Because these symptoms are delayed, you may only wake up with them in the middle of the night after an evening meal.  How is an alpha-gal allergy diagnosed? Diagnosis of this allergy starts with  your allergist taking an appropriate history and physical examination. Because the onset is usually quite delayed, it can be hard to associate the symptoms with eating red meat many hours previously. Triggers include any red meat - including beef, pork, lamb or even horse products. It may occur after eating hotdogs and hamburgers. In very rare cases the reaction may extend to milk or dairy proteins and gelatin.  Your allergist may recommend testing that includes skin tests to the relevant animal proteins and blood tests which measure the levels of a specific immunoglobulin E (IgE) antibody, to mammalian meats. An investigational blood test, IgE against alpha-gal itself, may also aid in the diagnosis.  How is an alpha-gal allergy treated? Immediate symptoms such as hives or shortness of breath are treated the same as any other food allergy - in an urgent care setting with anti-histamines, epinephrine and other medications. Prevention long-term involves avoidance of all red meat in sensitized individuals. You may be advised to carry an epinephrine auto-injector, to be used in case of subsequent accidental exposures and reaction. These measures do not necessarily mean switching to a full vegetarian diet, since poultry and fish can be consumed and do not cause similar reactions. As with other food allergies, there is the possibility that over time the sensitivity diminishes - although these changes may take many years to become apparent.  How do you become allergic to alpha-gal? Alpha-gal is a molecule carried in the saliva of the [place] tick and other potential arthropods typically  after feeding on mammalian blood. People that are bitten by the tick, especially those that are bitten repeatedly, are at risk of becoming sensitized and producing the IgE necessary to then cause allergic reactions. Interestingly, allergic reactions may occur to red meat, to subsequent tick bites, and even to medications that  contain alpha-gal. Cetuximab is a cancer medication that contains alpha-gal, and people who have had allergic reactions to this medication (these are typically immediate reactions, because it is infused intravenously) have a higher risk for red meat allergy and are likely to have been bitten by ticks in the past. As might be expected, the incidence of tick bites is much higher in the [place] the traditional habitat for the tick. However, cases are now increasingly reported in the [place] and [place] states. And it is a phenomenon that has been observed worldwide, with different ticks responsible for similar cases of red meat allergy in many other countries such as [place], [place] and [place].  The discovery of this peculiar allergy has allowed researchers to correlate tick bites with many cases of anaphylaxis that would previously have been classified as 'idiopathic', or of unknown cause. Also, while it was originally thought that the [employer] tick had to feast on mammalian blood in order to carry the alpha-gal molecule, more recent research has shown that it may carry this molecule and be capable of sensitizing humans independently.  How do you prevent an alpha-gal allergy? Because this allergy is predominantly tick born, you are more likely at risk if you often go outdoors in wooded areas for activities such as hiking, fishing or hunting. The key strategy is to prevent tick bites. This may include wearing long sleeved shirts or pants, using appropriate insect repellants, and surveying for ticks after spending time outdoors. Any observed ticks should be removed carefully by cleaning the site with rubbing alcohol, then using tweezers to pull the tick's head up carefully from the skin using steady pressure. Clean your hands and the site one more time and make sure not to crush the tick between your fingers.

## 2019-07-31 NOTE — Progress Notes (Signed)
New Patient Note  RE: Jessica Mooney MRN: [id number] DOB: [birth year] Date of Office Visit: 07/31/2019  Referring provider: Maryruth Hancock, MD Primary care provider: Maryruth Hancock, MD  Chief Complaint: alpha gal allergy  History of present illness: Jessica Mooney is a 59 y.o. female presenting today for consultation for alpha gal allergy.    Last May 2020 she was bitten by a tick on her right thigh and did develop a bull's-eye-like rash at the tick bite site.  She was treated for Lyme disease and states she had 2 rounds of antibiotics for this.  She also had an alpha gal panel done that was positive for the alpha gal IgE at 5.88 KU/L as well as beef 2.09 KU/L, lamb 1.1 KU/L, pork 0.96 KU/L.  She states she was advised not to eat red meat however she states that she does not eat red meat in her diet even prior to having the tick bite. She is mostly concerned at this time as she has plans for her job to travel to [place] in June if able to at that time if not she states the shot will be planned for the following June.  She states she knows the common foods eaten in [place] include red meat products.  She wants to be prepared to travel in case she does potentially have a reaction.  She has not had any reactions up to this point related to any red meat ingestion.  However she states since she has been bitten by the tick and has had the positive alpha gal levels she states that she stopped eating pizza as she "does not feel good" after she eats it and does no abdominal pain and cramping.  She also states that she has never really been able to drink milk but can use it in her cereal.  She has been limiting her dairy in the diet.  She has not noticed any issues thus far with gelatin based products and she does state that her vitamin D is in a gel capsule. She states she woke up this morning with a red right eye with drainage.  She states she is seeing her doctor today about this issue.  Review of  systems: Review of Systems  Constitutional: Negative.   HENT: Negative.   Eyes: Positive for discharge and redness.  Respiratory: Negative.   Cardiovascular: Negative.   Gastrointestinal: Negative.   Musculoskeletal: Negative.   Skin: Negative.   Neurological: Negative.     All other systems negative unless noted above in HPI  Past medical history: Past Medical History:  Diagnosis Date  . Blood transfusion without reported diagnosis    age 41  . Fibroid   . Foraminal stenosis of cervical region 2016  . Hypertension   . Hypothyroidism   . Vitamin D deficiency     Past surgical history: Past Surgical History:  Procedure Laterality Date  . CESAREAN SECTION  1989    Family history:  Family History  Problem Relation Age of Onset  . Breast cancer Sister 75  . Hypertension Mother   . Thyroid disease Mother        thyroidectomy  . Hypertension Father   . Stroke Maternal Grandfather     Social history: She lives in a home without carpeting with heat pump.  No pets in the home.  No concern for water damage, mildew or roaches in the home.  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  She denies a smoking history.  Medication List: Current Outpatient Medications  Medication Sig Dispense Refill  . amLODipine (NORVASC) 5 MG tablet TAKE ONE (1) TABLET EACH DAY 90 tablet 1  . ARMOUR THYROID 30 MG tablet Take 1 tablet by mouth daily.    . bisoprolol-hydrochlorothiazide (ZIAC) 5-6.25 MG per tablet Take 1 tablet by mouth daily.    . Cholecalciferol (VITAMIN D3) 1000 units CAPS Take 1 capsule by mouth daily.    . furosemide (LASIX) 40 MG tablet Take 1 tablet by mouth as needed.    . Multiple Vitamin (MULTIVITAMIN) tablet Take 1 tablet by mouth daily.    . Multiple Vitamin (ONE DAILY) tablet Take by mouth.    . TROKENDI XR 25 MG CP24 Take 1 tablet by mouth daily.    Marland Kitchen EPINEPHrine (EPIPEN 2-PAK) 0.3 mg/0.3 mL IJ SOAJ injection Inject 0.3 mLs (0.3 mg  total) into the muscle once for 1 dose. 2 each 1  . hydrocortisone 2.5 % cream     . triamcinolone cream (KENALOG) 0.1 %      No current facility-administered medications for this visit.    Known medication allergies: Allergies  Allergen Reactions  . Sulfur      Physical examination: Blood pressure (!) 142/100, pulse 62, temperature 97.7 F (36.5 C), temperature source Temporal, resp. rate 18, height 5\' 7"  (1.702 m), weight 227 lb 12.8 oz (103.3 kg), last menstrual period 07/07/2010, SpO2 98 %.  General: Alert, interactive, in no acute distress. HEENT: Right eye with scleral injection and watery drainage, left eye is normal, TMs pearly gray, turbinates non-edematous without discharge, post-pharynx non erythematous. Neck: Supple without lymphadenopathy. Lungs: Clear to auscultation without wheezing, rhonchi or rales. {no increased work of breathing. CV: Normal S1, S2 without murmurs. Abdomen: Nondistended, nontender. Skin: Warm and dry, without lesions or rashes. Extremities:  No clubbing, cyanosis or edema. Neuro:   Grossly intact.  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  - continue avoidance of red meats and dairy in the diet.     Dairy and gelatin are by-products of mammals that can also lead to symptoms with ingestion related to alpha gal allergy.   You also need to be cautious with certain vaccines that can contain pork/gelatin products   - have access to self-injectable epinephrine (Epipen or AuviQ) 0.3mg  at all times  - follow emergency action plan in case of allergic reaction  - obtain repeat alpha gal allergy panel after Oct 08, 2018 for yearly trend  Follow-up 1 year or sooner if needed    I appreciate the  opportunity to take part in Jessica Mooney's care. Please do not hesitate to contact me with questions.  Sincerely,   Prudy Feeler, MD Allergy/Immunology [facility]

## 2019-08-01 ENCOUNTER — Ambulatory Visit: Payer: 59 | Admitting: Family Medicine

## 2019-08-01 ENCOUNTER — Encounter: Payer: Self-pay | Admitting: Family Medicine

## 2019-08-01 VITALS — BP 126/83 | HR 66 | Temp 97.1°F | Ht 67.0 in | Wt 228.0 lb

## 2019-08-01 DIAGNOSIS — H1033 Unspecified acute conjunctivitis, bilateral: Secondary | ICD-10-CM | POA: Insufficient documentation

## 2019-08-01 MED ORDER — ERYTHROMYCIN 5 MG/GM OP OINT: TOPICAL_OINTMENT | OPHTHALMIC | 0 refills | Status: DC

## 2019-08-01 NOTE — Patient Instructions (Signed)

## 2019-08-01 NOTE — Progress Notes (Signed)
Established Patient Office Visit  Subjective:  Patient ID: Jessica Mooney, female    DOB: Dec 25, 1960  Age: 59 y.o. MRN: [id number]  CC:  Chief Complaint  Patient presents with  . Conjunctivitis    bilateral started on Monday    HPI Jessica Mooney presents for concern for eye irritation, drainage and lid swelling-started on Monday. Pt states grandchild have conjunctivitis-she was assisting with drops and now has symptoms.  NO fever. Light sensitivity -wearing sun glasses. Vision blurry while watching TV last night  Past Medical History:  Diagnosis Date  . Blood transfusion without reported diagnosis    age 47  . Fibroid   . Foraminal stenosis of cervical region 2016  . Hypertension   . Hypothyroidism   . Vitamin D deficiency     Past Surgical History:  Procedure Laterality Date  . CESAREAN SECTION  1989    Family History  Problem Relation Age of Onset  . Breast cancer Sister 50  . Hypertension Mother   . Thyroid disease Mother        thyroidectomy  . Hypertension Father   . Stroke Maternal Grandfather     Social History   Socioeconomic History  . Marital status: Married    Spouse name: Not on file  . Number of children: Not on file  . Years of education: Not on file  . Highest education level: Not on file  Occupational History  . Not on file  Tobacco Use  . Smoking status: Never Smoker  . Smokeless tobacco: Never Used  Substance and Sexual Activity  . Alcohol use: No    Alcohol/week: 0.0 standard drinks  . Drug use: No  . Sexual activity: Yes    Partners: Male    Birth control/protection: Post-menopausal  Other Topics Concern  . Not on file  Social History Narrative  . Not on file   Social Determinants of Health   Financial Resource Strain:   . Difficulty of Paying Living Expenses: Not on file  Food Insecurity:   . Worried About Charity fundraiser in the Last Year: Not on file  . Ran Out of Food in the Last Year: Not on file   Transportation Needs:   . Lack of Transportation (Medical): Not on file  . Lack of Transportation (Non-Medical): Not on file  Physical Activity:   . Days of Exercise per Week: Not on file  . Minutes of Exercise per Session: Not on file  Stress:   . Feeling of Stress : Not on file  Social Connections:   . Frequency of Communication with Friends and Family: Not on file  . Frequency of Social Gatherings with Friends and Family: Not on file  . Attends Religious Services: Not on file  . Active Member of Clubs or Organizations: Not on file  . Attends Archivist Meetings: Not on file  . Marital Status: Not on file  Intimate Partner Violence:   . Fear of Current or Ex-Partner: Not on file  . Emotionally Abused: Not on file  . Physically Abused: Not on file  . Sexually Abused: Not on file    Outpatient Medications Prior to Visit  Medication Sig Dispense Refill  . amLODipine (NORVASC) 5 MG tablet TAKE ONE (1) TABLET EACH DAY 90 tablet 1  . ARMOUR THYROID 30 MG tablet Take 1 tablet by mouth daily.    . bisoprolol-hydrochlorothiazide (ZIAC) 5-6.25 MG per tablet Take 1 tablet by mouth daily.    . Cholecalciferol (  VITAMIN D3) 1000 units CAPS Take 1 capsule by mouth daily.    Marland Kitchen EPINEPHrine (EPIPEN JR) 0.15 MG/0.3ML injection     . furosemide (LASIX) 40 MG tablet Take 1 tablet by mouth as needed.    . hydrocortisone 2.5 % cream     . Multiple Vitamin (MULTIVITAMIN) tablet Take 1 tablet by mouth daily.    Marland Kitchen triamcinolone cream (KENALOG) 0.1 %     . TROKENDI XR 25 MG CP24 Take 1 tablet by mouth daily.    . Multiple Vitamin (ONE DAILY) tablet Take by mouth.     No facility-administered medications prior to visit.    Allergies  Allergen Reactions  . Sulfur     ROS Review of Systems  Eyes: Positive for photophobia, discharge, redness, itching and visual disturbance.  Respiratory: Negative.       Objective:    Physical Exam  Constitutional: She appears well-nourished. No  distress.  Eyes: Right eye exhibits discharge. Left eye exhibits discharge. Right conjunctiva is injected. Left conjunctiva is injected.    BP 126/83 (BP Location: Left Arm, Patient Position: Sitting)   Pulse 66   Temp (!) 97.1 F (36.2 C) (Temporal)   Ht 5\' 7"  (1.702 m)   Wt 228 lb (103.4 kg)   LMP 07/07/2010   SpO2 96%   BMI 35.71 kg/m  Wt Readings from Last 3 Encounters:  08/01/19 228 lb (103.4 kg)  07/31/19 227 lb 12.8 oz (103.3 kg)  04/24/19 232 lb 6.4 oz (105.4 kg)     Health Maintenance Due  Topic Date Due  . Hepatitis C Screening  1961-03-25  . HIV Screening  01/01/1976  . COLONOSCOPY  01/01/2011  . TETANUS/TDAP  10/22/2014  . INFLUENZA VACCINE  01/05/2019     Lab Results  Component Value Date   TSH 2.38 02/27/2019   Lab Results  Component Value Date   WBC 7.4 02/27/2019   HGB 14.0 02/27/2019   HCT 42 02/27/2019   PLT 343 02/27/2019   Lab Results  Component Value Date   NA 142 02/27/2019   K 3.9 02/27/2019   CO2 27 (A) 02/27/2019   BUN 12 02/27/2019   CREATININE 1.0 02/27/2019   ALKPHOS 62 02/27/2019   AST 14 02/27/2019   ALT 7 02/27/2019   ALBUMIN 4.3 02/27/2019   CALCIUM 9.7 02/27/2019   Lab Results  Component Value Date   CHOL 216 (A) 02/27/2019   Lab Results  Component Value Date   HDL 39 02/27/2019   Lab Results  Component Value Date   LDLCALC 142 02/27/2019   Lab Results  Component Value Date   TRIG 201 (A) 02/27/2019     Assessment & Plan:   1. Acute bacterial conjunctivitis of both eyes Sulfa and cipro allergy Meds ordered this encounter  Medications  . erythromycin ophthalmic ointment    Sig: 0.5inch to bilat lid QID    Dispense:  3.5 g    Refill:  0    Follow-up:prn-pt education discussed on diagnosis   Kashtyn Jankowski Hannah Beat, MD

## 2019-08-05 ENCOUNTER — Telehealth: Payer: Self-pay | Admitting: Family Medicine

## 2019-08-05 NOTE — Telephone Encounter (Signed)
Schedule pt for an office visit so we can evaluate

## 2019-08-05 NOTE — Telephone Encounter (Signed)
Pt states she was seen last week for pink eye and states her eye is now swollen underneath but she is unsure if she is having an allergic reaction. Requesting nurse to call back

## 2019-08-05 NOTE — Telephone Encounter (Signed)
Please advise 

## 2019-08-06 NOTE — Telephone Encounter (Signed)
Patient is going to see her ophthalmologist.

## 2019-08-12 NOTE — Progress Notes (Signed)
59 y.o. [id number] Married [place] American female here for annual exam.    Had a tick bite last year.   Sees PCP for labs.   PCP:  Benny Lennert, MD  Patient's last menstrual period was 07/07/2010.           Sexually active: Yes.    The current method of family planning is post menopausal status.    Exercising: Yes.    walking Smoker:  no  Health Maintenance: Pap: 08-03-18 Neg:Neg HR HPV, 06-15-15 Neg:Neg HR HPV History of abnormal Pap:  no MMG: 01-25-19 3D/Neg/density B/BiRads1 Colonoscopy:  NEVER.  Patient will do Cologard with PCP.  BMD:  02-11-14  Result:  Normal TDaP:  PCP Gardasil:   no HIV:no Hep C:no Screening Labs:  PCP.    reports that she has never smoked. She has never used smokeless tobacco. She reports that she does not drink alcohol or use drugs.  Past Medical History:  Diagnosis Date  . Allergy to alpha-gal 10/2018  . Blood transfusion without reported diagnosis    age 50  . Fibroid   . Foraminal stenosis of cervical region 2016  . Hypertension   . Hypothyroidism   . Vitamin D deficiency     Past Surgical History:  Procedure Laterality Date  . CESAREAN SECTION  1989    Current Outpatient Medications  Medication Sig Dispense Refill  . amLODipine (NORVASC) 5 MG tablet TAKE ONE (1) TABLET EACH DAY 90 tablet 1  . ARMOUR THYROID 30 MG tablet Take 1 tablet by mouth daily.    . bisoprolol-hydrochlorothiazide (ZIAC) 5-6.25 MG per tablet Take 1 tablet by mouth daily.    . Cholecalciferol (VITAMIN D3) 1000 units CAPS Take 1 capsule by mouth daily.    Marland Kitchen EPINEPHrine (EPIPEN JR) 0.15 MG/0.3ML injection     . erythromycin ophthalmic ointment 0.5inch to bilat lid QID 3.5 g 0  . furosemide (LASIX) 40 MG tablet Take 1 tablet by mouth as needed.    . hydrocortisone 2.5 % cream     . Multiple Vitamin (MULTIVITAMIN) tablet Take 1 tablet by mouth daily.    Marland Kitchen triamcinolone cream (KENALOG) 0.1 %     . TROKENDI XR 25 MG CP24 Take 1 tablet by mouth daily.     No current  facility-administered medications for this visit.    Family History  Problem Relation Age of Onset  . Breast cancer Sister 93  . Hypertension Mother   . Thyroid disease Mother        thyroidectomy  . Hypertension Father   . Stroke Maternal Grandfather     Review of Systems  All other systems reviewed and are negative.   Exam:   BP 118/70 (Cuff Size: Large)   Pulse 64   Temp (!) 96.8 F (36 C) (Temporal)   Resp 16   Ht 5\' 7"  (1.702 m)   Wt 225 lb 12.8 oz (102.4 kg)   LMP 07/07/2010   BMI 35.37 kg/m     General appearance: alert, cooperative and appears stated age Head: normocephalic, without obvious abnormality, atraumatic Neck: no adenopathy, supple, symmetrical, trachea midline and thyroid normal to inspection and palpation Lungs: clear to auscultation bilaterally Breasts: normal appearance, no masses or tenderness, No nipple retraction or dimpling, No nipple discharge or bleeding, No axillary adenopathy Heart: regular rate and rhythm Abdomen: soft, non-tender; no masses, no organomegaly Extremities: extremities normal, atraumatic, no cyanosis or edema Skin: skin color, texture, turgor normal. No rashes or lesions Lymph nodes: cervical, supraclavicular,  and axillary nodes normal. Neurologic: grossly normal  Pelvic: External genitalia:  no lesions              No abnormal inguinal nodes palpated.              Urethra:  normal appearing urethra with no masses, tenderness or lesions              Bartholins and Skenes: normal                 Vagina: normal appearing vagina with normal color and discharge, no lesions              Cervix: no lesions              Pap taken: No. Bimanual Exam:  Uterus:  normal size, contour, position, consistency, mobility, non-tender              Adnexa: no mass, fullness, tenderness              Rectal exam: Yes.  .  Confirms.              Anus:  normal sphincter tone, no lesions  Chaperone was present for exam.  Assessment:   Well  woman visit with normal exam. FH of breast cancer.  Hypothyroidism.  Menopausal female.  Plan: Mammogram screening discussed. Self breast awareness reviewed. Pap and HR HPV as above. Guidelines for Calcium, Vitamin D, regular exercise program including cardiovascular and weight bearing exercise. Labs with PCP. Covid vaccine recommended. Follow up annually and prn.   After visit summary provided.

## 2019-08-14 ENCOUNTER — Encounter: Payer: Self-pay | Admitting: Obstetrics and Gynecology

## 2019-08-14 ENCOUNTER — Other Ambulatory Visit: Payer: Self-pay

## 2019-08-14 ENCOUNTER — Ambulatory Visit: Payer: 59 | Admitting: Obstetrics and Gynecology

## 2019-08-14 VITALS — BP 118/70 | HR 64 | Temp 96.8°F | Resp 16 | Ht 67.0 in | Wt 225.8 lb

## 2019-08-14 DIAGNOSIS — Z01419 Encounter for gynecological examination (general) (routine) without abnormal findings: Secondary | ICD-10-CM | POA: Diagnosis not present

## 2019-08-14 NOTE — Patient Instructions (Signed)

## 2019-09-03 ENCOUNTER — Other Ambulatory Visit: Payer: Self-pay | Admitting: Obstetrics and Gynecology

## 2019-09-03 DIAGNOSIS — Z1231 Encounter for screening mammogram for malignant neoplasm of breast: Secondary | ICD-10-CM

## 2019-09-09 ENCOUNTER — Telehealth: Payer: Self-pay | Admitting: Family Medicine

## 2019-09-09 ENCOUNTER — Other Ambulatory Visit: Payer: Self-pay

## 2019-09-09 DIAGNOSIS — I10 Essential (primary) hypertension: Secondary | ICD-10-CM

## 2019-09-09 MED ORDER — BISOPROLOL-HYDROCHLOROTHIAZIDE 5-6.25 MG PO TABS: 1.0000 | ORAL_TABLET | Freq: Every day | ORAL | 1 refills | Status: DC

## 2019-09-09 NOTE — Telephone Encounter (Signed)
Patient is calling and requesting a refill on the following medication. She states the pharmacy tried requesting multiple times last week.  bisoprolol-hydrochlorothiazide (ZIAC) 5-6.25 MG per tablet    [facility] [address] [place] [address] [place] Phone:  [phone]  Fax:  [phone]

## 2019-10-09 ENCOUNTER — Other Ambulatory Visit: Payer: Self-pay | Admitting: *Deleted

## 2019-10-09 DIAGNOSIS — Z91018 Allergy to other foods: Secondary | ICD-10-CM

## 2019-10-09 DIAGNOSIS — T7800XA Anaphylactic reaction due to unspecified food, initial encounter: Secondary | ICD-10-CM

## 2019-10-15 LAB — ALPHA-GAL PANEL
Alpha Gal IgE*: 1.64 kU/L — ABNORMAL HIGH (ref <0.10)
Beef (Bos spp) IgE: 0.73 kU/L — ABNORMAL HIGH (ref <0.35)
Class Interpretation: 1
Class Interpretation: 1
Class Interpretation: 2
Lamb/Mutton (Ovis spp) IgE: 0.47 kU/L — ABNORMAL HIGH (ref <0.35)
Pork (Sus spp) IgE: 0.44 kU/L — ABNORMAL HIGH (ref <0.35)

## 2019-10-23 ENCOUNTER — Ambulatory Visit: Payer: 59 | Admitting: Family Medicine

## 2019-10-28 ENCOUNTER — Other Ambulatory Visit: Payer: Self-pay | Admitting: Family Medicine

## 2019-10-29 ENCOUNTER — Other Ambulatory Visit: Payer: Self-pay | Admitting: Family Medicine

## 2019-10-29 MED ORDER — AMLODIPINE BESYLATE 5 MG PO TABS: 5.0000 mg | ORAL_TABLET | Freq: Every day | ORAL | 0 refills | Status: DC

## 2019-11-16 IMAGING — MR MR LUMBAR SPINE W/O CM
4 of 5 series · 15 of 48 positions shown · non-contrast
Comparison: Lumbar radiographs 11/20/2017

CLINICAL DATA: Low back pain with left leg pain

EXAM:
MRI LUMBAR SPINE WITHOUT CONTRAST
TECHNIQUE: Multiplanar, multisequence MR imaging of the lumbar spine was
performed. No intravenous contrast was administered.

[Series 3: T2 · sagittal · 4.0mm · 0.69mm/px · 6 of 15 slices shown (1 of 2)]
[im 1/15]
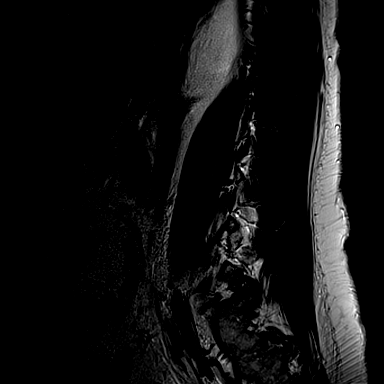
[im 3/15]
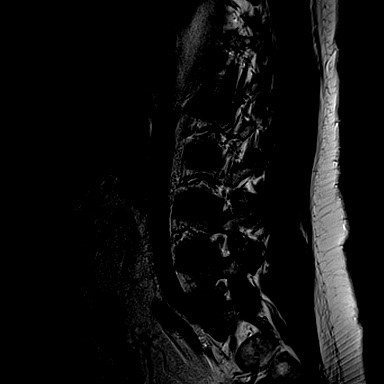
[im 6/15]
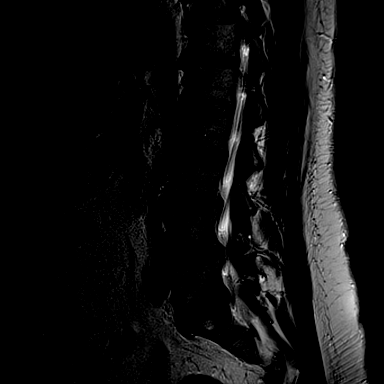
[im 9/15]
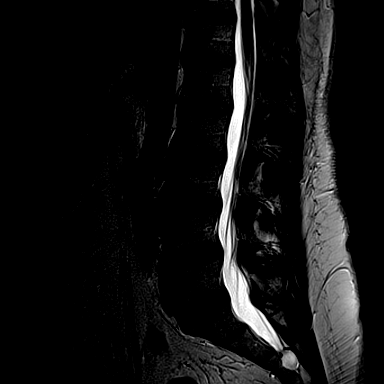
[im 12/15]
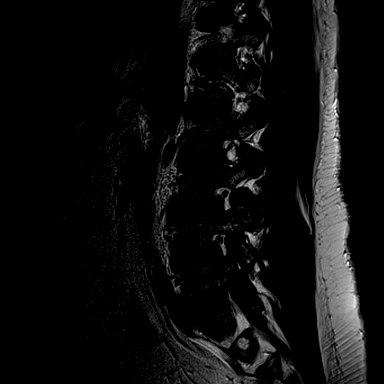
[im 15/15]
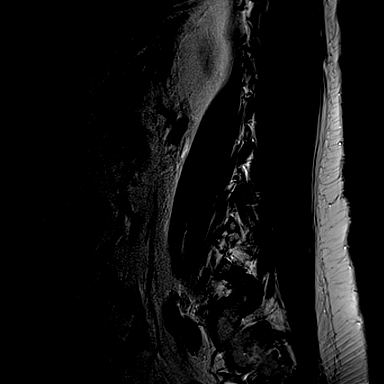

[Series 4: T1 · sagittal · 4.0mm · 0.36mm/px · 3 of 15 slices shown (1 of 2)]
[im 3/15]
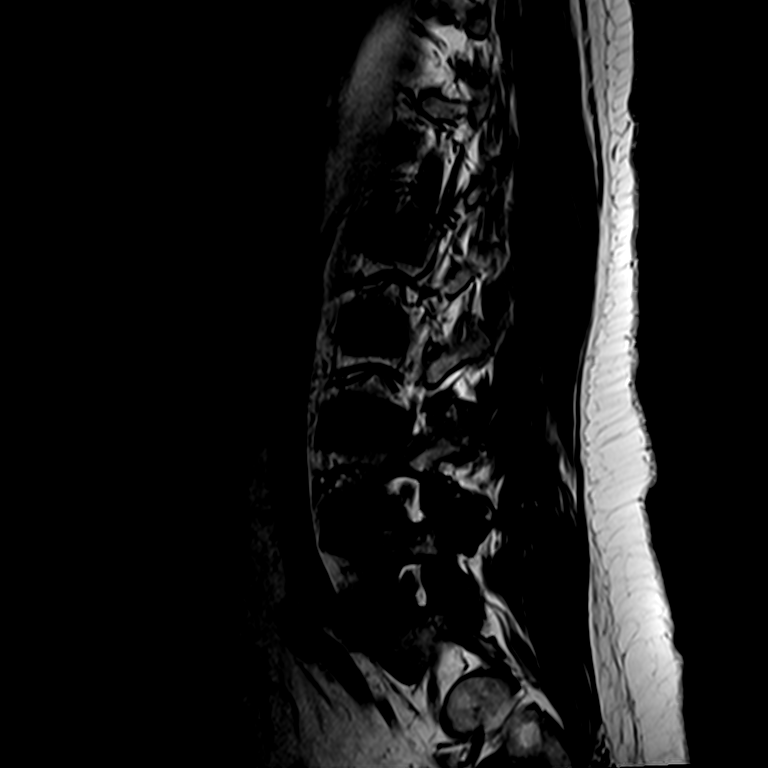
[im 9/15]
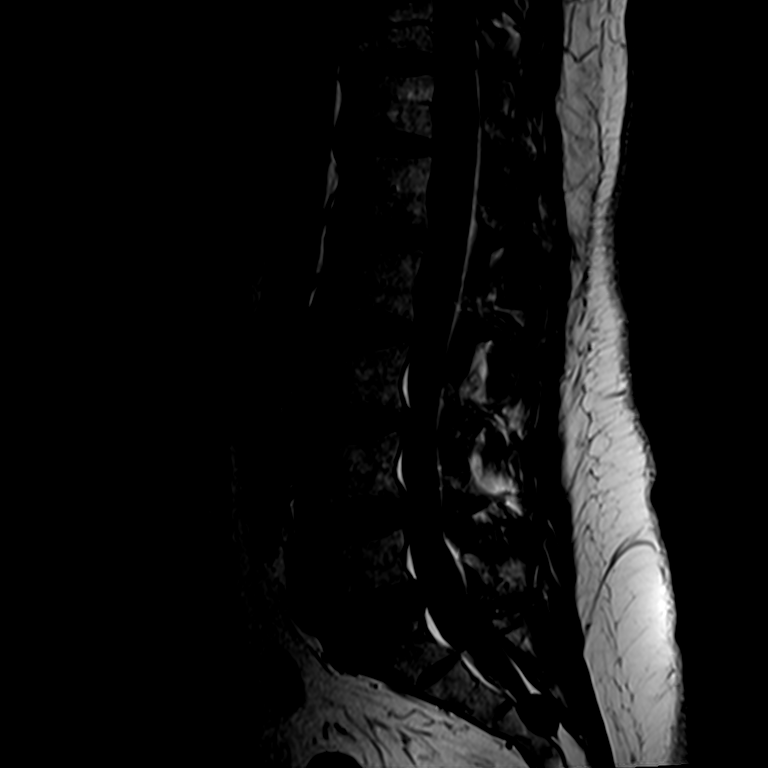
[im 15/15]
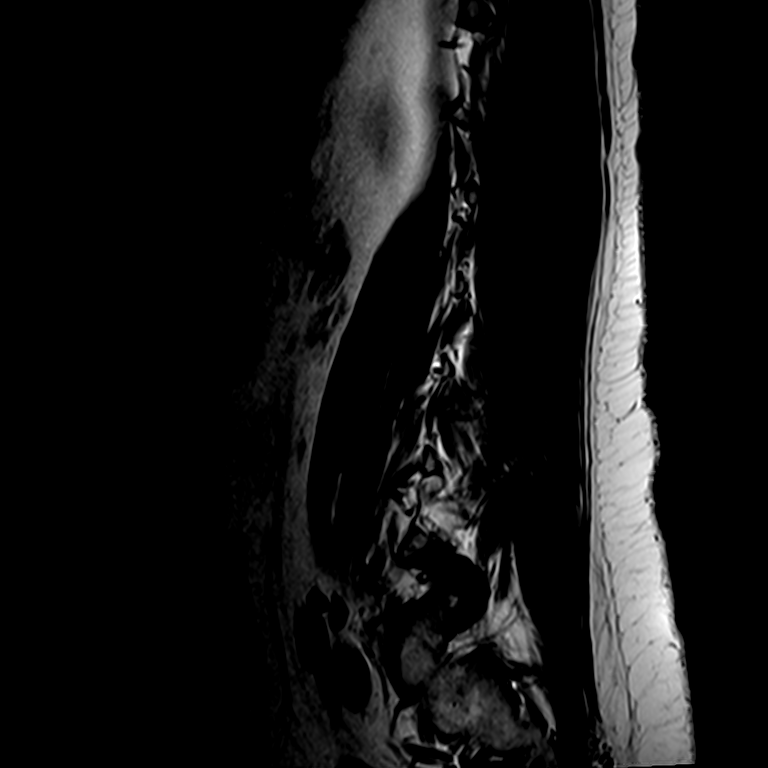

[Series 6: T2 · axial · 4.0mm · 0.23mm/px · z∈[-145,-12]mm · 3 of 39 slices shown (2 of 2)]
[im 6/39]
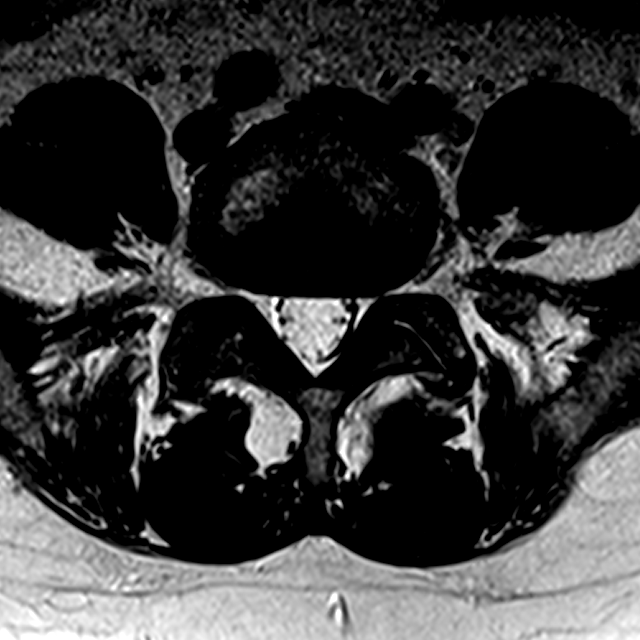
[im 20/39]
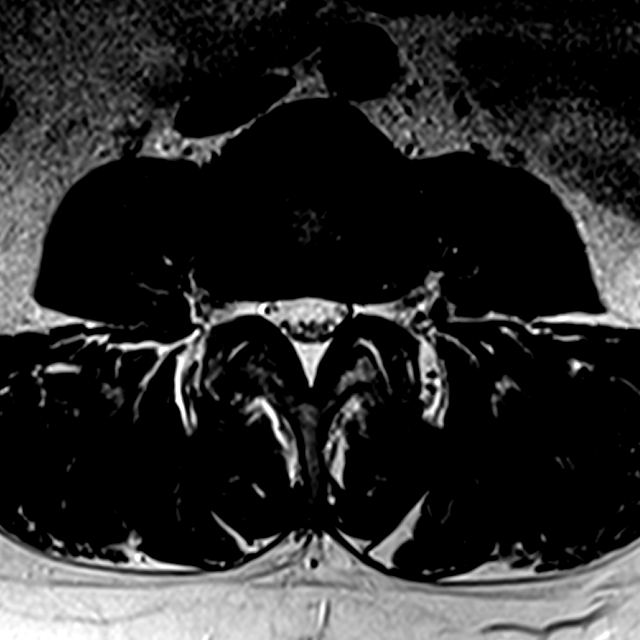
[im 33/39]
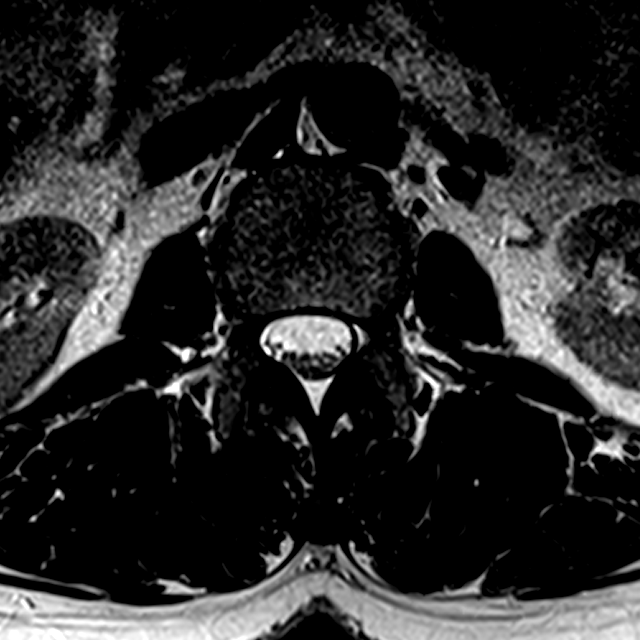

[Series 7: T1 · axial · 4.0mm · 0.23mm/px · z∈[-146,-11]mm · 3 of 39 slices shown (2 of 2)]
[im 6/39]
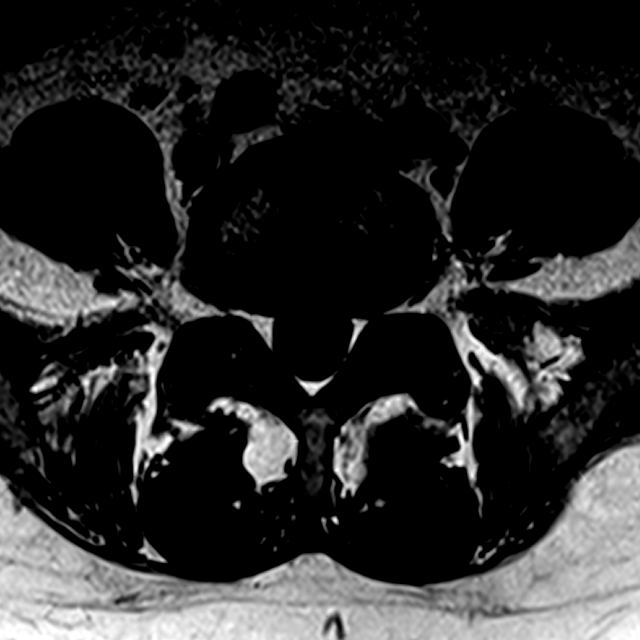
[im 20/39]
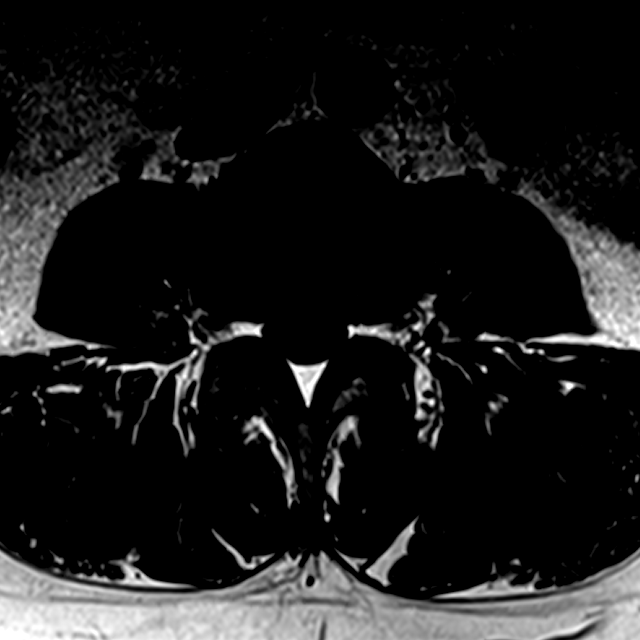
[im 33/39]
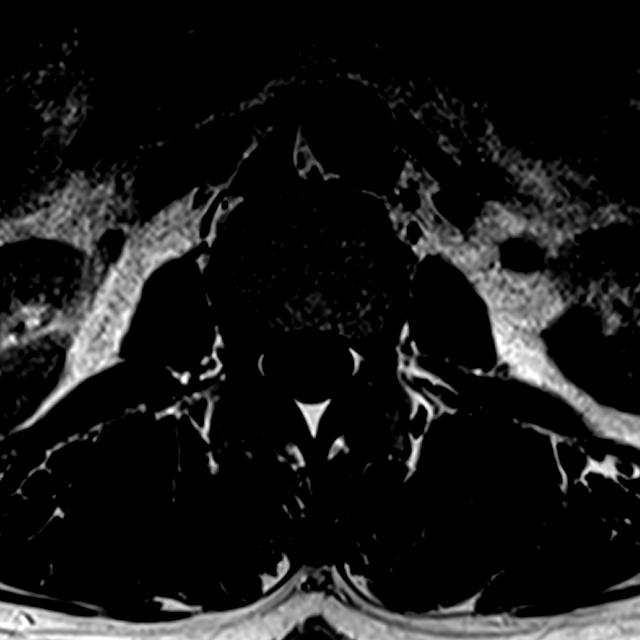

[15 of 48 positions shown; findings below may reference images not displayed]

FINDINGS: Segmentation:  Normal

Alignment:  Mild anterolisthesis L4-5 1 mm.

Vertebrae: Normal bone marrow. Negative for fracture or mass.
Hemangiomata T12 and L1 vertebral bodies.

Conus medullaris and cauda equina: Conus extends to the L1-2 level.
Conus and cauda equina appear normal.

Paraspinal and other soft tissues: Negative for paraspinous mass or
soft tissue edema

Disc levels:

L1-2: Negative

L2-3: Mild facet degeneration and mild disc degeneration without
stenosis

L3-4: Mild disc bulging. Small extraforaminal disc protrusion on the
left without neural compression. Mild facet degeneration without
significant spinal stenosis.

L4-5: 1 mm anterolisthesis. Disc bulging and facet degeneration.
Negative for stenosis

L5-S1: Small left-sided disc protrusion. Extruded disc fragment in
the subarticular zone, extending cranially with impingement of the
left L5 nerve root. Mild facet degeneration.
IMPRESSION: Small extraforaminal disc protrusion on the left at L3-4 without
neural compression

Mild anterolisthesis L4-5 with disc and facet degeneration

Small extruded disc fragment on the left at L5-S1 with impingement
of the left L5 nerve root.

## 2019-12-04 ENCOUNTER — Other Ambulatory Visit: Payer: Self-pay

## 2019-12-04 ENCOUNTER — Ambulatory Visit (INDEPENDENT_AMBULATORY_CARE_PROVIDER_SITE_OTHER): Payer: 59 | Admitting: Internal Medicine

## 2019-12-04 ENCOUNTER — Encounter (INDEPENDENT_AMBULATORY_CARE_PROVIDER_SITE_OTHER): Payer: Self-pay | Admitting: Internal Medicine

## 2019-12-04 VITALS — BP 135/90 | HR 65 | Temp 96.9°F | Ht 67.0 in | Wt 231.8 lb

## 2019-12-04 DIAGNOSIS — E669 Obesity, unspecified: Secondary | ICD-10-CM | POA: Diagnosis not present

## 2019-12-04 DIAGNOSIS — I10 Essential (primary) hypertension: Secondary | ICD-10-CM

## 2019-12-04 DIAGNOSIS — R5383 Other fatigue: Secondary | ICD-10-CM

## 2019-12-04 DIAGNOSIS — E559 Vitamin D deficiency, unspecified: Secondary | ICD-10-CM

## 2019-12-04 DIAGNOSIS — E782 Mixed hyperlipidemia: Secondary | ICD-10-CM | POA: Diagnosis not present

## 2019-12-04 DIAGNOSIS — R232 Flushing: Secondary | ICD-10-CM

## 2019-12-04 DIAGNOSIS — E039 Hypothyroidism, unspecified: Secondary | ICD-10-CM

## 2019-12-04 DIAGNOSIS — R5381 Other malaise: Secondary | ICD-10-CM

## 2019-12-04 MED ORDER — TRIAMCINOLONE ACETONIDE 0.5 % EX CREA: 1.0000 "application " | TOPICAL_CREAM | Freq: Three times a day (TID) | CUTANEOUS | 0 refills | Status: DC

## 2019-12-04 NOTE — Progress Notes (Signed)
Metrics: Intervention Frequency ACO  Documented Smoking Status Yearly  Screened one or more times in 24 months  Cessation Counseling or  Active cessation medication Past 24 months  Past 24 months   Guideline developer: UpToDate (See UpToDate for funding source) Date Released: 2014       Wellness Office Visit  Subjective:  Patient ID: Jessica Mooney, female    DOB: [birth year]  Age: 59 y.o. MRN: [id number]  CC: This 59 year old lady comes to practice to establish care.  Her previous physician was Dr. Sinda Du, who is now retired. HPI  She has a history of hypothyroidism, hypertension.  She is postmenopausal for the last 8 years and continues to have hot flashes.  Her sister has a history of breast cancer and she is somewhat afraid of hormones. Past Medical History:  Diagnosis Date  . Allergy to alpha-gal 10/2018  . Blood transfusion without reported diagnosis    age 28  . Fibroid   . Foraminal stenosis of cervical region 2016  . Hypertension   . Hypothyroidism   . Vitamin D deficiency    Past Surgical History:  Procedure Laterality Date  . CESAREAN SECTION  1989     Family History  Problem Relation Age of Onset  . Breast cancer Sister 30  . Hypertension Sister   . Hypertension Mother   . Thyroid disease Mother        thyroidectomy  . Hypertension Father   . Stroke Maternal Grandfather   . Hypertension Sister   . Hypertension Sister   . Hypertension Sister   . 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   Social History   Tobacco Use  . Smoking status: Never Smoker  . Smokeless tobacco: Never Used  Substance Use Topics  . Alcohol use: No    Alcohol/week: 0.0 standard drinks    Current Meds  Medication Sig  . amLODipine (NORVASC) 5 MG tablet Take 1 tablet (5 mg total) by mouth daily.  Francia Greaves THYROID 30 MG tablet TAKE TWO  AND ONE-HALF TABLETS BY MOUTH EVERY DAY  . bisoprolol-hydrochlorothiazide (ZIAC) 5-6.25 MG tablet Take 1 tablet by mouth daily.  . Cholecalciferol (VITAMIN D3) 125 MCG (5000 UT) CAPS Take 2 capsules by mouth daily.   Marland Kitchen EPINEPHrine (EPIPEN JR) 0.15 MG/0.3ML injection   . furosemide (LASIX) 40 MG tablet TAKE ONE (1) TABLET EACH DAY AS NEEDED FOR FLUID  . Multiple Vitamin (MULTIVITAMIN) tablet Take 1 tablet by mouth daily.  Marland Kitchen TROKENDI XR 25 MG CP24 Take 1 tablet by mouth daily.       Depression screen PHQ 2/9 04/24/2019  Decreased Interest 0  Down, Depressed, Hopeless 0  PHQ - 2 Score 0     Objective:   Today's Vitals: BP 135/90 (BP Location: Left Arm, Patient Position: Sitting, Cuff Size: Normal)   Pulse 65   Temp (!) 96.9 F (36.1 C) (Temporal)   Ht 5\' 7"  (1.702 m)   Wt 231 lb 12.8 oz (105.1 kg)   LMP 07/07/2010   SpO2 97%   BMI 36.31 kg/m  Vitals with BMI 12/04/2019 08/14/2019 08/01/2019  Height 5\' 7"  5\' 7"  5\' 7"   Weight 231 lbs 13 oz 225 lbs 13 oz 228 lbs  BMI 36.3 22.29 79.8  Systolic 921 194 174  Diastolic 90 70 83  Pulse 65 64 66     Physical Exam  She looks systemically well.  She is obese.  Blood pressure is elevated compared to the last time she was seen in the system.  Alert and orientated without any obvious focal neurological signs. She has a skin lesion on the left breast superiorly which looks like dermatitis.     Assessment   1. Acquired hypothyroidism   2. Essential hypertension   3. Mixed hyperlipidemia   4. Obesity (BMI 30-39.9)   5. Vitamin D deficiency disease   6. Malaise and fatigue   7. Hot flashes       Tests ordered Orders Placed This Encounter  Procedures  . CBC  . COMPLETE METABOLIC PANEL WITH GFR  . Hemoglobin A1c  . Lipid panel  . T3, free  . T4  . TSH  . Testos,Total,Free and SHBG (Female)  . Progesterone  . Estradiol  . VITAMIN D 25 Hydroxy (Vit-D Deficiency, Fractures)     Plan: 1. She will continue with her  Armour Thyroid for the time being and we will check thyroid function test. 2. She will continue with amlodipine for hypertension.  This is somewhat uncontrolled today but this is her first visit with me today also and we will monitor the trend. 3. She will continue with vitamin D3 supplementation and we will check levels today. 4. I have sent a prescription for triamcinolone cream for her dermatitis on the left breast area to see if this will help her. 5. Follow-up in about a month's time to review her results and further recommendations.   Meds ordered this encounter  Medications  . triamcinolone cream (KENALOG) 0.5 %    Sig: Apply 1 application topically 3 (three) times daily.    Dispense:  30 g    Refill:  0    Bless Belshe Luther Parody, MD

## 2019-12-07 LAB — LIPID PANEL
Cholesterol: 226 mg/dL — ABNORMAL HIGH (ref <200)
HDL: 42 mg/dL — ABNORMAL LOW (ref >=50)
LDL Cholesterol (Calc): 154 mg/dL (calc) — ABNORMAL HIGH
Non-HDL Cholesterol (Calc): 184 mg/dL (calc) — ABNORMAL HIGH (ref <130)
Total CHOL/HDL Ratio: 5.4 (calc) — ABNORMAL HIGH (ref <5.0)
Triglycerides: 167 mg/dL — ABNORMAL HIGH (ref <150)

## 2019-12-07 LAB — COMPLETE METABOLIC PANEL WITH GFR
AG Ratio: 1.4 (calc) (ref 1.0–2.5)
ALT: 7 U/L (ref 6–29)
AST: 13 U/L (ref 10–35)
Albumin: 4.4 g/dL (ref 3.6–5.1)
Alkaline phosphatase (APISO): 69 U/L (ref 37–153)
BUN/Creatinine Ratio: 12 (calc) (ref 6–22)
BUN: 13 mg/dL (ref 7–25)
CO2: 29 mmol/L (ref 20–32)
Calcium: 10.3 mg/dL (ref 8.6–10.4)
Chloride: 104 mmol/L (ref 98–110)
Creat: 1.09 mg/dL — ABNORMAL HIGH (ref 0.50–1.05)
GFR, Est African American: 65 mL/min/{1.73_m2} (ref >=60)
GFR, Est Non African American: 56 mL/min/{1.73_m2} — ABNORMAL LOW (ref >=60)
Globulin: 3.2 g/dL (calc) (ref 1.9–3.7)
Glucose, Bld: 95 mg/dL (ref 65–139)
Potassium: 4 mmol/L (ref 3.5–5.3)
Sodium: 141 mmol/L (ref 135–146)
Total Bilirubin: 0.4 mg/dL (ref 0.2–1.2)
Total Protein: 7.6 g/dL (ref 6.1–8.1)

## 2019-12-07 LAB — PROGESTERONE: Progesterone: <0.5 ng/mL

## 2019-12-07 LAB — CBC
HCT: 42.3 % (ref 35.0–45.0)
Hemoglobin: 14 g/dL (ref 11.7–15.5)
MCH: 28.9 pg (ref 27.0–33.0)
MCHC: 33.1 g/dL (ref 32.0–36.0)
MCV: 87.2 fL (ref 80.0–100.0)
MPV: 10.5 fL (ref 7.5–12.5)
Platelets: 346 10*3/uL (ref 140–400)
RBC: 4.85 10*6/uL (ref 3.80–5.10)
RDW: 13.3 % (ref 11.0–15.0)
WBC: 9 10*3/uL (ref 3.8–10.8)

## 2019-12-07 LAB — VITAMIN D 25 HYDROXY (VIT D DEFICIENCY, FRACTURES): Vit D, 25-Hydroxy: 70 ng/mL (ref 30–100)

## 2019-12-07 LAB — HEMOGLOBIN A1C
Hgb A1c MFr Bld: 5.2 % of total Hgb (ref <5.7)
Mean Plasma Glucose: 103 (calc)
eAG (mmol/L): 5.7 (calc)

## 2019-12-07 LAB — T4: T4, Total: 7.2 ug/dL (ref 5.1–11.9)

## 2019-12-07 LAB — T3, FREE: T3, Free: 3.6 pg/mL (ref 2.3–4.2)

## 2019-12-07 LAB — TESTOS,TOTAL,FREE AND SHBG (FEMALE)
Free Testosterone: 11.5 pg/mL — ABNORMAL HIGH (ref 0.1–6.4)
Sex Hormone Binding: 42 nmol/L (ref 14–73)
Testosterone, Total, LC-MS-MS: 76 ng/dL — ABNORMAL HIGH (ref 2–45)

## 2019-12-07 LAB — TSH: TSH: 1.94 mIU/L (ref 0.40–4.50)

## 2019-12-07 LAB — ESTRADIOL: Estradiol: 17 pg/mL

## 2019-12-17 ENCOUNTER — Other Ambulatory Visit: Payer: Self-pay | Admitting: Family Medicine

## 2019-12-24 ENCOUNTER — Other Ambulatory Visit (INDEPENDENT_AMBULATORY_CARE_PROVIDER_SITE_OTHER): Payer: Self-pay

## 2019-12-24 MED ORDER — ARMOUR THYROID 30 MG PO TABS: ORAL_TABLET | ORAL | 0 refills | Status: DC

## 2019-12-31 ENCOUNTER — Encounter (INDEPENDENT_AMBULATORY_CARE_PROVIDER_SITE_OTHER): Payer: Self-pay | Admitting: Internal Medicine

## 2019-12-31 ENCOUNTER — Other Ambulatory Visit: Payer: Self-pay

## 2019-12-31 ENCOUNTER — Ambulatory Visit (INDEPENDENT_AMBULATORY_CARE_PROVIDER_SITE_OTHER): Payer: 59 | Admitting: Internal Medicine

## 2019-12-31 VITALS — BP 120/80 | HR 74 | Temp 96.8°F | Ht 67.0 in | Wt 228.6 lb

## 2019-12-31 DIAGNOSIS — E039 Hypothyroidism, unspecified: Secondary | ICD-10-CM | POA: Diagnosis not present

## 2019-12-31 DIAGNOSIS — E282 Polycystic ovarian syndrome: Secondary | ICD-10-CM

## 2019-12-31 DIAGNOSIS — E669 Obesity, unspecified: Secondary | ICD-10-CM | POA: Diagnosis not present

## 2019-12-31 HISTORY — DX: Polycystic ovarian syndrome: E28.2

## 2019-12-31 MED ORDER — ARMOUR THYROID 90 MG PO TABS: 90.0000 mg | ORAL_TABLET | Freq: Every day | ORAL | 3 refills | Status: DC

## 2019-12-31 NOTE — Patient Instructions (Signed)
[facility] Dietary Recommendations for Weight Loss What to Avoid . Avoid added sugars o Often added sugar can be found in processed foods such as many condiments, dry cereals, cakes, cookies, chips, crisps, crackers, candies, sweetened drinks, etc.  o Read labels and AVOID/DECREASE use of foods with the following in their ingredient list: Sugar, fructose, high fructose corn syrup, sucrose, glucose, maltose, dextrose, molasses, cane sugar, brown sugar, any type of syrup, agave nectar, etc.   . Avoid snacking in between meals . Avoid foods made with flour o If you are going to eat food made with flour, choose those made with whole-grains; and, minimize your consumption as much as is tolerable . Avoid processed foods o These foods are generally stocked in the middle of the grocery store. Focus on shopping on the perimeter of the grocery.  . Avoid Meat  o We recommend following a plant-based diet at [facility]. Thus, we recommend avoiding meat as a general rule. Consider eating beans, legumes, eggs, and/or dairy products for regular protein sources o If you plan on eating meat limit to 4 ounces of meat at a time and choose lean options such as Fish, chicken, turkey. Avoid red meat intake such as pork and/or steak What to Include . Vegetables o GREEN LEAFY VEGETABLES: Kale, spinach, mustard greens, collard greens, cabbage, broccoli, etc. o OTHER: Asparagus, cauliflower, eggplant, carrots, peas, Brussel sprouts, tomatoes, bell peppers, zucchini, beets, cucumbers, etc. . Grains, seeds, and legumes o Beans: kidney beans, black eyed peas, garbanzo beans, black beans, pinto beans, etc. o Whole, unrefined grains: brown rice, barley, bulgur, oatmeal, etc. . Healthy fats  o Avoid highly processed fats such as vegetable oil o Examples of healthy fats: avocado, olives, virgin olive oil, dark chocolate (?72% Cocoa), nuts (peanuts, almonds, walnuts, cashews, pecans, etc.) . None to Low  Intake of Animal Sources of Protein o Meat sources: chicken, turkey, salmon, tuna. Limit to 4 ounces of meat at one time. o Consider limiting dairy sources, but when choosing dairy focus on: PLAIN Greek yogurt, cottage cheese, high-protein milk . Fruit o Choose berries  When to Eat . Intermittent Fasting: o Choosing not to eat for a specific time period, but DO FOCUS ON HYDRATION when fasting o Multiple Techniques: - Time Restricted Eating: eat 3 meals in a day, each meal lasting no more than 60 minutes, no snacks between meals - 16-18 hour fast: fast for 16 to 18 hours up to 7 days a week. Often suggested to start with 2-3 nonconsecutive days per week.  . Remember the time you sleep is counted as fasting.  . Examples of eating schedule: Fast from 7:00pm-11:00am. Eat between 11:00am-7:00pm.  - 24-hour fast: fast for 24 hours up to every other day. Often suggested to start with 1 day per week . Remember the time you sleep is counted as fasting . Examples of eating schedule:  o Eating day: eat 2-3 meals on your eating day. If doing 2 meals, each meal should last no more than 90 minutes. If doing 3 meals, each meal should last no more than 60 minutes. Finish last meal by 7:00pm. o Fasting day: Fast until 7:00pm.  o IF YOU FEEL UNWELL FOR ANY REASON/IN ANY WAY WHEN FASTING, STOP FASTING BY EATING A NUTRITIOUS SNACK OR LIGHT MEAL o ALWAYS FOCUS ON HYDRATION DURING FASTS - Acceptable Hydration sources: water, broths, tea/coffee (black tea/coffee is best but using a small amount of whole-fat dairy products in coffee/tea is acceptable).  -   Poor Hydration Sources: anything with sugar or artificial sweeteners added to it  These recommendations have been developed for patients that are actively receiving medical care from either Dr. Jahmere Bramel or Sarah Gray, DNP, NP-C at [facility]. These recommendations are developed for patients with specific medical conditions and are not meant to be  distributed or used by others that are not actively receiving care from either provider listed above at [facility]. It is not appropriate to participate in the above eating plans without proper medical supervision.   Reference: Fung, J. The obesity code. [place]: [place]; 2016.   

## 2019-12-31 NOTE — Progress Notes (Signed)
Metrics: Intervention Frequency ACO  Documented Smoking Status Yearly  Screened one or more times in 24 months  Cessation Counseling or  Active cessation medication Past 24 months  Past 24 months   Guideline developer: UpToDate (See UpToDate for funding source) Date Released: 2014       Wellness Office Visit  Subjective:  Patient ID: [facility], female    DOB: 1961-03-11  Age: 59 y.o. MRN: [id number]  CC: This lady comes in to review all her blood work and further recommendations. HPI  She has hypothyroidism and her T3 levels are still suboptimal.  She describes brain fog, fatigue, dry skin. Her hormones indicate she is in the menopause but also testosterone levels are elevated indicate that she likely has PCOS.  On closer questioning, she describes acne, some hirsutism and also irregular periods when she was younger.  She certainly has hypertension.  She is on medication for this. She does suffer from hot flashes and she is hesitant about hormone therapy because her sister had a history of breast cancer. In terms of nutrition, she has been doing intermittent fasting approximately 16 hours every day.  She continues to eat chicken 3 times a week or so. Past Medical History:  Diagnosis Date  . Allergy to alpha-gal 10/2018  . Blood transfusion without reported diagnosis    age 79  . Fibroid   . Foraminal stenosis of cervical region 2016  . Hypertension   . Hypothyroidism   . PCOS (polycystic ovarian syndrome) 12/31/2019  . Vitamin D deficiency    Past Surgical History:  Procedure Laterality Date  . CESAREAN SECTION  1989     Family History  Problem Relation Age of Onset  . Breast cancer Sister 56  . Hypertension Sister   . Hypertension Mother   . Thyroid disease Mother        thyroidectomy  . Hypertension Father   . Stroke Maternal Grandfather   . Hypertension Sister   . Hypertension Sister   . Hypertension Sister   . 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   Social History   Tobacco Use  . Smoking status: Never Smoker  . Smokeless tobacco: Never Used  Substance Use Topics  . Alcohol use: No    Alcohol/week: 0.0 standard drinks    Current Meds  Medication Sig  . amLODipine (NORVASC) 5 MG tablet Take 1 tablet (5 mg total) by mouth daily.  Francia Greaves THYROID 30 MG tablet TAKE TWO AND ONE-HALF TABLETS BY MOUTH EVERY DAY  . bisoprolol-hydrochlorothiazide (ZIAC) 5-6.25 MG tablet Take 1 tablet by mouth daily.  . Cholecalciferol (VITAMIN D3) 125 MCG (5000 UT) CAPS Take 2 capsules by mouth daily.   Marland Kitchen EPINEPHrine (EPIPEN JR) 0.15 MG/0.3ML injection   . furosemide (LASIX) 40 MG tablet TAKE ONE (1) TABLET EACH DAY AS NEEDED FOR FLUID  . Multiple Vitamin (MULTIVITAMIN) tablet Take 1 tablet by mouth daily.  Marland Kitchen triamcinolone cream (KENALOG) 0.5 % Apply 1 application topically 3 (three) times daily.  Marland Kitchen TROKENDI XR 25 MG CP24 Take 1 tablet by mouth daily.       Depression screen PHQ 2/9 04/24/2019  Decreased Interest 0  Down, Depressed, Hopeless 0  PHQ - 2 Score 0     Objective:   Today's Vitals: BP 120/80 (BP Location: Left Arm, Patient Position: Sitting, Cuff Size: Normal)   Pulse 74  Temp (!) 96.8 F (36 C) (Temporal)   Ht 5\' 7"  (1.702 m)   Wt (!) 228 lb 9.6 oz (103.7 kg)   LMP 07/07/2010   SpO2 98%   BMI 35.80 kg/m  Vitals with BMI 12/31/2019 12/04/2019 08/14/2019  Height 5\' 7"  5\' 7"  5\' 7"   Weight 228 lbs 10 oz 231 lbs 13 oz 225 lbs 13 oz  BMI 35.8 24.8 25.00  Systolic 370 488 891  Diastolic 80 90 70  Pulse 74 65 64     Physical Exam  She looks systemically well.  She has lost 3 pounds since the last visit.     Assessment   1. Acquired hypothyroidism   2. Obesity (BMI 30-39.9)   3. PCOS (polycystic ovarian syndrome)       Tests ordered No orders of the defined types were placed in this  encounter.    Plan: 1. I think we can optimize her thyroid further and I have sent a new prescription for Armour Thyroid 90 mg daily and hopefully she will tolerate it. 2. As far as her PCOS is concerned, I discussed with her the condition, the pathophysiology and risks of breast cancer, uterine cancer, diabetes and hypertension as well as heart disease.  I discussed the importance of reducing visceral fat and we discussed intermittent fasting again and I would like her to go longer hours every day combined with a more plant-based diet. 3. I will see her in about 6 weeks time to see how she is doing and at that time we will discuss bioidentical hormone therapy.   Meds ordered this encounter  Medications  . ARMOUR THYROID 90 MG tablet    Sig: Take 1 tablet (90 mg total) by mouth daily.    Dispense:  30 tablet    Refill:  3    New dose    Ciara Kagan Luther Parody, MD

## 2020-01-18 ENCOUNTER — Other Ambulatory Visit (INDEPENDENT_AMBULATORY_CARE_PROVIDER_SITE_OTHER): Payer: Self-pay | Admitting: Internal Medicine

## 2020-01-25 ENCOUNTER — Other Ambulatory Visit (INDEPENDENT_AMBULATORY_CARE_PROVIDER_SITE_OTHER): Payer: Self-pay | Admitting: Internal Medicine

## 2020-01-27 ENCOUNTER — Ambulatory Visit: Payer: 59

## 2020-02-04 ENCOUNTER — Other Ambulatory Visit (INDEPENDENT_AMBULATORY_CARE_PROVIDER_SITE_OTHER): Payer: Self-pay

## 2020-02-04 DIAGNOSIS — I10 Essential (primary) hypertension: Secondary | ICD-10-CM

## 2020-02-04 MED ORDER — ARMOUR THYROID 30 MG PO TABS
ORAL_TABLET | ORAL | 0 refills | Status: DC
Start: 2020-02-04 — End: 2020-03-30

## 2020-02-04 MED ORDER — FUROSEMIDE 40 MG PO TABS
40.0000 mg | ORAL_TABLET | Freq: Every day | ORAL | 0 refills | Status: DC | PRN
Start: 2020-02-04 — End: 2020-07-14

## 2020-02-04 MED ORDER — TRIAMCINOLONE ACETONIDE 0.5 % EX CREA: 1.0000 "application " | TOPICAL_CREAM | Freq: Three times a day (TID) | CUTANEOUS | 0 refills | Status: AC

## 2020-02-04 MED ORDER — AMLODIPINE BESYLATE 5 MG PO TABS: ORAL_TABLET | ORAL | 0 refills | Status: DC

## 2020-02-04 MED ORDER — BISOPROLOL-HYDROCHLOROTHIAZIDE 5-6.25 MG PO TABS: 1.0000 | ORAL_TABLET | Freq: Every day | ORAL | 1 refills | Status: DC

## 2020-02-05 ENCOUNTER — Other Ambulatory Visit: Payer: Self-pay

## 2020-02-05 ENCOUNTER — Ambulatory Visit
Admission: RE | Admit: 2020-02-05 | Discharge: 2020-02-05 | Disposition: A | Discharge status: Home / Self Care | Payer: 59 | Source: Ambulatory Visit | Attending: Obstetrics and Gynecology | Admitting: Obstetrics and Gynecology

## 2020-02-05 DIAGNOSIS — Z1231 Encounter for screening mammogram for malignant neoplasm of breast: Secondary | ICD-10-CM

## 2020-02-18 ENCOUNTER — Ambulatory Visit (INDEPENDENT_AMBULATORY_CARE_PROVIDER_SITE_OTHER): Payer: 59 | Admitting: Internal Medicine

## 2020-03-09 ENCOUNTER — Other Ambulatory Visit (INDEPENDENT_AMBULATORY_CARE_PROVIDER_SITE_OTHER): Payer: Self-pay

## 2020-03-09 DIAGNOSIS — I10 Essential (primary) hypertension: Secondary | ICD-10-CM

## 2020-03-09 MED ORDER — BISOPROLOL-HYDROCHLOROTHIAZIDE 5-6.25 MG PO TABS: 1.0000 | ORAL_TABLET | Freq: Every day | ORAL | 1 refills | Status: DC

## 2020-03-19 ENCOUNTER — Other Ambulatory Visit (INDEPENDENT_AMBULATORY_CARE_PROVIDER_SITE_OTHER): Payer: Self-pay

## 2020-03-19 DIAGNOSIS — I10 Essential (primary) hypertension: Secondary | ICD-10-CM

## 2020-03-19 MED ORDER — BISOPROLOL-HYDROCHLOROTHIAZIDE 5-6.25 MG PO TABS: 1.0000 | ORAL_TABLET | Freq: Every day | ORAL | 1 refills | Status: DC

## 2020-03-23 ENCOUNTER — Telehealth (INDEPENDENT_AMBULATORY_CARE_PROVIDER_SITE_OTHER): Payer: Self-pay

## 2020-03-30 ENCOUNTER — Other Ambulatory Visit: Payer: Self-pay

## 2020-03-30 ENCOUNTER — Ambulatory Visit (INDEPENDENT_AMBULATORY_CARE_PROVIDER_SITE_OTHER): Payer: 59 | Admitting: Internal Medicine

## 2020-03-30 ENCOUNTER — Encounter (INDEPENDENT_AMBULATORY_CARE_PROVIDER_SITE_OTHER): Payer: Self-pay | Admitting: Internal Medicine

## 2020-03-30 VITALS — BP 122/86 | HR 58 | Temp 97.5°F | Ht 67.0 in | Wt 221.2 lb

## 2020-03-30 DIAGNOSIS — I10 Essential (primary) hypertension: Secondary | ICD-10-CM | POA: Diagnosis not present

## 2020-03-30 DIAGNOSIS — E282 Polycystic ovarian syndrome: Secondary | ICD-10-CM | POA: Diagnosis not present

## 2020-03-30 DIAGNOSIS — E669 Obesity, unspecified: Secondary | ICD-10-CM

## 2020-03-30 DIAGNOSIS — E039 Hypothyroidism, unspecified: Secondary | ICD-10-CM

## 2020-03-30 DIAGNOSIS — R232 Flushing: Secondary | ICD-10-CM

## 2020-03-30 MED ORDER — PROGESTERONE MICRONIZED 100 MG PO CAPS: 100.0000 mg | ORAL_CAPSULE | Freq: Every day | ORAL | 3 refills | Status: DC

## 2020-03-30 MED ORDER — ESTRADIOL 0.5 MG PO TABS: 0.5000 mg | ORAL_TABLET | Freq: Every day | ORAL | 3 refills | Status: DC

## 2020-03-30 NOTE — Progress Notes (Signed)
Metrics: Intervention Frequency ACO  Documented Smoking Status Yearly  Screened one or more times in 24 months  Cessation Counseling or  Active cessation medication Past 24 months  Past 24 months   Guideline developer: UpToDate (See UpToDate for funding source) Date Released: 2014       Wellness Office Visit  Subjective:  Patient ID: [facility], female    DOB: 1961-02-24  Age: 59 y.o. MRN: [id number]  CC: This lady comes in for follow-up of hypothyroidism, PCOS, hypertension and menopausal symptoms of hot flashes and night sweats. HPI  She has tolerated the higher dose of Armour Thyroid.  She continues to work on nutrition and has managed to lose 7 pounds since the last visit. She continues to have hot flashes and night sweats.  She is bothered by this even though her husband does not think this is a big deal. Past Medical History:  Diagnosis Date  . Allergy to alpha-gal 10/2018  . Blood transfusion without reported diagnosis    age 22  . Fibroid   . Foraminal stenosis of cervical region 2016  . Hypertension   . Hypothyroidism   . PCOS (polycystic ovarian syndrome) 12/31/2019  . Vitamin D deficiency    Past Surgical History:  Procedure Laterality Date  . CESAREAN SECTION  1989     Family History  Problem Relation Age of Onset  . Breast cancer Sister 11  . Hypertension Sister   . Hypertension Mother   . Thyroid disease Mother        thyroidectomy  . Hypertension Father   . Stroke Maternal Grandfather   . Hypertension Sister   . Hypertension Sister   . Hypertension Sister   . 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   Social History   Tobacco Use  . Smoking status: Never Smoker  . Smokeless tobacco: Never Used  Substance Use Topics  . Alcohol use: No    Alcohol/week: 0.0 standard drinks    Current Meds  Medication Sig  .  amLODipine (NORVASC) 5 MG tablet TAKE ONE (1) TABLET BY MOUTH EVERY DAY  . ARMOUR THYROID 90 MG tablet Take 1 tablet (90 mg total) by mouth daily.  . bisoprolol-hydrochlorothiazide (ZIAC) 5-6.25 MG tablet Take 1 tablet by mouth daily.  . Cholecalciferol (VITAMIN D3) 125 MCG (5000 UT) CAPS Take 2 capsules by mouth daily.   Marland Kitchen EPINEPHrine (EPIPEN JR) 0.15 MG/0.3ML injection   . furosemide (LASIX) 40 MG tablet Take 1 tablet (40 mg total) by mouth daily as needed.  . Multiple Vitamin (MULTIVITAMIN) tablet Take 1 tablet by mouth daily.  Marland Kitchen triamcinolone cream (KENALOG) 0.5 % Apply 1 application topically 3 (three) times daily.  Marland Kitchen TROKENDI XR 25 MG CP24 Take 1 tablet by mouth daily.  . [DISCONTINUED] ARMOUR THYROID 30 MG tablet TAKE TWO AND ONE-HALF TABLETS BY MOUTH EVERY DAY      Depression screen [facility] 2/9 04/24/2019  Decreased Interest 0  Down, Depressed, Hopeless 0  PHQ - 2 Score 0     Objective:   Today's Vitals: BP 122/86   Pulse (!) 58   Temp (!) 97.5 F (36.4 C) (Temporal)   Ht 5\' 7"  (1.702 m)   Wt 221 lb 3.2 oz (100.3 kg)   LMP 07/07/2010   SpO2 93%   BMI 34.64 kg/m  Vitals with BMI 03/30/2020 12/31/2019 12/04/2019  Height 5\' 7"   5\' 7"  5\' 7"   Weight 221 lbs 3 oz 228 lbs 10 oz 231 lbs 13 oz  BMI 34.64 01.7 51.0  Systolic 258 527 782  Diastolic 86 80 90  Pulse 58 74 65     Physical Exam   She looks systemically well.  She has lost 7 pounds.  Blood pressure reasonable although diastolic is slightly elevated today.    Assessment   1. Acquired hypothyroidism   2. Obesity (BMI 30-39.9)   3. Essential hypertension   4. PCOS (polycystic ovarian syndrome)   5. Hot flashes       Tests ordered Orders Placed This Encounter  Procedures  . T3, free  . TSH     Plan: 1. She will continue with the same dose of Armour Thyroid and I will check thyroid function today to see if we need to further optimize. 2. She will continue to work on nutrition. 3. She will continue  with her antihypertensive medication listed. 4. I will start her on estradiol and progesterone for menopausal symptoms of hot flashes and night sweats.  I explained to her the rationale behind using bioidentical hormones versus synthetic hormones and there are other benefits such as reduction in risk of cardiovascular disease, cerebrovascular disease, breast cancer, osteoporosis and dementia. 5. I will see her for follow-up in about 6 weeks to see how she is doing and further recommendations will depend on blood results.   Meds ordered this encounter  Medications  . estradiol (ESTRACE) 0.5 MG tablet    Sig: Take 1 tablet (0.5 mg total) by mouth daily.    Dispense:  30 tablet    Refill:  3  . progesterone (PROMETRIUM) 100 MG capsule    Sig: Take 1 capsule (100 mg total) by mouth daily.    Dispense:  30 capsule    Refill:  3    Nakeia Calvi Luther Parody, MD

## 2020-03-31 LAB — T3, FREE: T3, Free: 4.1 pg/mL (ref 2.3–4.2)

## 2020-03-31 LAB — TSH: TSH: 0.51 mIU/L (ref 0.40–4.50)

## 2020-05-03 ENCOUNTER — Encounter (INDEPENDENT_AMBULATORY_CARE_PROVIDER_SITE_OTHER): Payer: Self-pay | Admitting: Internal Medicine

## 2020-05-06 ENCOUNTER — Encounter (INDEPENDENT_AMBULATORY_CARE_PROVIDER_SITE_OTHER): Payer: Self-pay | Admitting: Internal Medicine

## 2020-05-11 ENCOUNTER — Telehealth (INDEPENDENT_AMBULATORY_CARE_PROVIDER_SITE_OTHER): Payer: Self-pay | Admitting: Nurse Practitioner

## 2020-05-11 ENCOUNTER — Telehealth (INDEPENDENT_AMBULATORY_CARE_PROVIDER_SITE_OTHER): Payer: 59 | Admitting: Nurse Practitioner

## 2020-05-11 DIAGNOSIS — U071 COVID-19: Secondary | ICD-10-CM

## 2020-05-11 NOTE — Telephone Encounter (Signed)
When you have a moment please call the radiology department and ask them if someone who is positive with Covid19 can be sent over for a chest xray. If so, does radiology need to be notified prior to patient arrival?

## 2020-05-11 NOTE — Progress Notes (Signed)
Due to national recommendations of social distancing related to the [place] pandemic, an audio-only tele-health visit was felt to be the most appropriate encounter type for this patient today. I connected with  Jessica [facility]oney on 05/11/20 utilizing audio-only technology and verified that I am speaking with the correct person using two identifiers. The patient was located at their home, and I was located at the office of [facility] during the encounter. I discussed the limitations of evaluation and management by telemedicine. The patient expressed understanding and agreed to proceed.    Subjective:  Patient ID: [facility], female    DOB: 1960/10/13  Age: 59 y.o. MRN: [id number]  CC:  Chief Complaint  Patient presents with  . Other    Covid 19      HPI  This patient arrives today for virtual visit the above.  She was diagnosed with COVID-19 on 05/06/2020.  I do not see where the test results are in our system so she must of been tested outside the [facility] health system.  Her husband is also positive for COVID-19 and has been hospitalized with pneumonia.  She is very concerned that she to may be developing pneumonia.  She tells me her symptoms are stable and while she does have a cough she does not feel extremely short of breath.  She has called the monoclonal antibody outpatient infusion center and is waiting for them to call her back to see if she would qualify for the monoclonal antibody infusions.  She is fully vaccinated against COVID-19 but has not yet received her booster as she just now became due for this.  Past Medical History:  Diagnosis Date  . Allergy to alpha-gal 10/2018  . Blood transfusion without reported diagnosis    age 31  . Fibroid   . Foraminal stenosis of cervical region 2016  . Hypertension   . Hypothyroidism   . PCOS (polycystic ovarian syndrome) 12/31/2019  . Vitamin D deficiency       Family History  Problem Relation Age of Onset  . Breast  cancer Sister 90  . Hypertension Sister   . Hypertension Mother   . Thyroid disease Mother        thyroidectomy  . Hypertension Father   . Stroke Maternal Grandfather   . Hypertension Sister   . Hypertension Sister   . Hypertension Sister   . 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   Social History   Tobacco Use  . Smoking status: Never Smoker  . Smokeless tobacco: Never Used  Substance Use Topics  . Alcohol use: No    Alcohol/week: 0.0 standard drinks     Current Meds  Medication Sig  . amLODipine (NORVASC) 5 MG tablet TAKE ONE (1) TABLET BY MOUTH EVERY DAY  . ARMOUR THYROID 90 MG tablet Take 1 tablet (90 mg total) by mouth daily.  . bisoprolol-hydrochlorothiazide (ZIAC) 5-6.25 MG tablet Take 1 tablet by mouth daily.  . Cholecalciferol (VITAMIN D3) 125 MCG (5000 UT) CAPS Take 2 capsules by mouth daily.   Marland Kitchen EPINEPHrine (EPIPEN JR) 0.15 MG/0.3ML injection   . furosemide (LASIX) 40 MG tablet Take 1 tablet (40 mg total) by mouth daily as needed.  . Multiple Vitamin (MULTIVITAMIN) tablet Take 1 tablet by mouth daily.  Marland Kitchen triamcinolone cream (KENALOG) 0.5 % Apply 1 application topically 3 (three) times daily.  ROS:  Review of Systems  Constitutional: Negative for chills.  Respiratory: Positive for cough. Negative for sputum production and shortness of breath.   Cardiovascular: Negative for chest pain.     Objective:   Today's Vitals: LMP 07/07/2010  Vitals with BMI 05/11/2020 03/30/2020 12/31/2019  Height - 5\' 7"  5\' 7"   Weight - 221 lbs 3 oz 228 lbs 10 oz  BMI - 71.24 58.0  Systolic (No Data) 998 338  Diastolic (No Data) 86 80  Pulse - 58 74     Physical Exam Comprehensive physical exam not conducted today as office visit was conducted remotely.  Patient did cough during our discussion.  She was able to talk in complete sentences.  She  appeared to have appropriate thought processes and judgment, and she is alert and oriented.      Assessment and Plan   1. COVID-19      Plan: 1.  I recommended that she continue to quarantine at home.  I provided her with reassurance that because she is fully vaccinated that hopefully severe disease will not occur in herself.  She tells me that her husband is diabetic, and I explained to her that diabetes is a risk factor for severe disease.  I did discuss that if her symptoms worsen especially if she start to experience shortness of breath she needs to proceed to the emergency department.  She tells me she understands.  She would like a chest x-ray to see if she has any pneumonia developing.  I did tell her that even if her chest x-ray shows pneumonia as long as her symptoms are stable and she is not experiencing significant shortness of breath treatment plan would not change.  She would still like to have x-ray completed if possible. Will consider this, will also call monoclonal antibody infusion center and leave referral message on her behalf.   Tests ordered No orders of the defined types were placed in this encounter.     No orders of the defined types were placed in this encounter.   Patient to follow-up as scheduled or sooner as needed. Telephone conversation lasted for 20 minutes.  Ailene Ards, NP

## 2020-05-11 NOTE — Telephone Encounter (Signed)
Per Rad tech at [facility] stated that she is allowed to come in to get a x-ray. However she would have to Come in the main hospital entrance, Wear a mask and wait up front and let person at  desk know she is here for covid testing & positive covid for a test. They will call Rad Dept to notify them she is down there waiting for a x-ray. They could work her in  tonight or in morning.

## 2020-05-12 NOTE — Telephone Encounter (Signed)
I was able to contact this patient by telephone this morning to check in with her.  She tells me she is feeling about the same but does not endorse any worsening shortness of breath.  I did discuss that it is possible to do outpatient chest x-ray if necessary, however as long as her breathing is stable I would not recommend going to have x-ray done due to the public health concern with possibly exposing other individuals the COVID-19 virus.  I did remind her that if her symptoms worsen especially if she has shortness of breath that she needs to notify us and if this occurs outside of office hours that she needs to proceed to the ER.  She tells me she understands.

## 2020-05-13 ENCOUNTER — Ambulatory Visit (INDEPENDENT_AMBULATORY_CARE_PROVIDER_SITE_OTHER): Payer: 59 | Admitting: Internal Medicine

## 2020-06-04 ENCOUNTER — Other Ambulatory Visit (INDEPENDENT_AMBULATORY_CARE_PROVIDER_SITE_OTHER): Payer: Self-pay | Admitting: Internal Medicine

## 2020-07-02 ENCOUNTER — Ambulatory Visit (INDEPENDENT_AMBULATORY_CARE_PROVIDER_SITE_OTHER): Payer: 59 | Admitting: Internal Medicine

## 2020-07-06 NOTE — Telephone Encounter (Signed)
closed

## 2020-07-14 ENCOUNTER — Other Ambulatory Visit (INDEPENDENT_AMBULATORY_CARE_PROVIDER_SITE_OTHER): Payer: Self-pay | Admitting: Internal Medicine

## 2020-07-21 ENCOUNTER — Ambulatory Visit (INDEPENDENT_AMBULATORY_CARE_PROVIDER_SITE_OTHER): Payer: 59 | Admitting: Internal Medicine

## 2020-07-21 ENCOUNTER — Encounter (INDEPENDENT_AMBULATORY_CARE_PROVIDER_SITE_OTHER): Payer: Self-pay | Admitting: Internal Medicine

## 2020-07-21 ENCOUNTER — Other Ambulatory Visit: Payer: Self-pay

## 2020-07-21 VITALS — BP 160/100 | HR 72 | Ht 67.0 in | Wt 217.4 lb

## 2020-07-21 DIAGNOSIS — E039 Hypothyroidism, unspecified: Secondary | ICD-10-CM | POA: Diagnosis not present

## 2020-07-21 DIAGNOSIS — E282 Polycystic ovarian syndrome: Secondary | ICD-10-CM | POA: Diagnosis not present

## 2020-07-21 DIAGNOSIS — R232 Flushing: Secondary | ICD-10-CM | POA: Diagnosis not present

## 2020-07-21 DIAGNOSIS — I10 Essential (primary) hypertension: Secondary | ICD-10-CM

## 2020-07-21 MED ORDER — AMLODIPINE BESYLATE 10 MG PO TABS: 10.0000 mg | ORAL_TABLET | Freq: Every day | ORAL | 1 refills | Status: DC

## 2020-07-21 NOTE — Progress Notes (Signed)
Metrics: Intervention Frequency ACO  Documented Smoking Status Yearly  Screened one or more times in 24 months  Cessation Counseling or  Active cessation medication Past 24 months  Past 24 months   Guideline developer: UpToDate (See UpToDate for funding source) Date Released: 2014       Wellness Office Visit  Subjective:  Patient ID: Jessica Mooney, female    DOB: 1961-03-25  Age: 60 y.o. MRN: [id number]  CC: This lady comes in for follow-up of hypothyroidism, hypertension, PCOS and menopausal symptoms. HPI  I had last seen her in October of last year when she had menopausal symptoms and started on estradiol and progesterone but unfortunately she never got those medications filled because she was thinking still about it.  In the meantime, her husband has become very sick in December with COVID-19 disease and unfortunately passed away so she has been grieving for the last 2 months or so. She becomes tearful on most days but does not feel that she is suicidal and is able to function in her job for most of the day.  She also lost her sister recently.  She realizes that she needs to get some help and she is now going to actively seek help from a therapist. She continues on Armour Thyroid 90 mg daily as before. She continues on several antihypertensive medications.  She is compliant with them.  These are listed below. Past Medical History:  Diagnosis Date  . Allergy to alpha-gal 10/2018  . Blood transfusion without reported diagnosis    age 89  . Fibroid   . Foraminal stenosis of cervical region 2016  . Hypertension   . Hypothyroidism   . PCOS (polycystic ovarian syndrome) 12/31/2019  . Vitamin D deficiency    Past Surgical History:  Procedure Laterality Date  . CESAREAN SECTION  1989     Family History  Problem Relation Age of Onset  . Breast cancer Sister 85  . Hypertension Sister   . Hypertension Mother   . Thyroid disease Mother        thyroidectomy  . Hypertension  Father   . Stroke Maternal Grandfather   . Hypertension Sister   . Hypertension Sister   . Hypertension Sister   . 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   Social History   Tobacco Use  . Smoking status: Never Smoker  . Smokeless tobacco: Never Used  Substance Use Topics  . Alcohol use: No    Alcohol/week: 0.0 standard drinks    Current Meds  Medication Sig  . amLODipine (NORVASC) 10 MG tablet Take 1 tablet (10 mg total) by mouth daily.  Marland Kitchen amLODipine (NORVASC) 5 MG tablet TAKE ONE (1) TABLET BY MOUTH EVERY DAY  . ARMOUR THYROID 90 MG tablet TAKE ONE TABLET (90MG  TOTAL) BY MOUTH DAILY  . bisoprolol-hydrochlorothiazide (ZIAC) 5-6.25 MG tablet Take 1 tablet by mouth daily.  . Cholecalciferol (VITAMIN D3) 125 MCG (5000 UT) CAPS Take 2 capsules by mouth daily.   Marland Kitchen EPINEPHrine (EPIPEN JR) 0.15 MG/0.3ML injection   . furosemide (LASIX) 40 MG tablet TAKE ONE TABLET (40MG  TOTAL) BY MOUTH DAILY AS NEEDED  . Multiple Vitamin (MULTIVITAMIN) tablet Take 1 tablet by mouth daily.  Marland Kitchen triamcinolone cream (KENALOG) 0.5 % Apply 1 application topically 3 (three) times daily.       Objective:   Today's Vitals: BP (!) 160/100  Pulse 72   Ht 5\' 7"  (1.702 m)   Wt 217 lb 6.4 oz (98.6 kg)   LMP 07/07/2010   BMI 34.05 kg/m  Vitals with BMI 07/21/2020 05/11/2020 03/30/2020  Height 5\' 7"  - 5\' 7"   Weight 217 lbs 6 oz - 221 lbs 3 oz  BMI 51.70 - 01.74  Systolic 944 (No Data) 967  Diastolic 591 (No Data) 86  Pulse 72 - 58     Physical Exam   She looks systemically well.  She remains obese.  Her blood pressure is elevated.  She is alert and orientated without any focal logical signs.    Assessment   1. Acquired hypothyroidism   2. Essential hypertension   3. PCOS (polycystic ovarian syndrome)   4. Hot flashes       Tests ordered No orders of  the defined types were placed in this encounter.    Plan: 1. She will continue with same dose of Armour Thyroid for the time being. 2. Her hypertension is not controlled and I have told her to increase the dose of amlodipine to 10 mg daily.  She will double up on the amlodipine 5 mg tablets and I have sent a new prescription of amlodipine 10 mg tablets, take 1 daily. 3. We will address the situation of her menopausal symptoms at a later date. 4. Follow-up in 3 months.  I have offered her any medications that she does/might need in terms of her mental state but at the present time she feels she is okay.   Meds ordered this encounter  Medications  . amLODipine (NORVASC) 10 MG tablet    Sig: Take 1 tablet (10 mg total) by mouth daily.    Dispense:  90 tablet    Refill:  1    Maghan Jessee Luther Parody, MD

## 2020-07-30 ENCOUNTER — Ambulatory Visit: Payer: 59 | Admitting: Allergy

## 2020-08-18 ENCOUNTER — Telehealth (INDEPENDENT_AMBULATORY_CARE_PROVIDER_SITE_OTHER): Payer: Self-pay

## 2020-08-18 ENCOUNTER — Other Ambulatory Visit (INDEPENDENT_AMBULATORY_CARE_PROVIDER_SITE_OTHER): Payer: Self-pay | Admitting: Internal Medicine

## 2020-08-18 ENCOUNTER — Ambulatory Visit: Payer: 59 | Admitting: Obstetrics and Gynecology

## 2020-08-18 MED ORDER — AMLODIPINE BESYLATE 5 MG PO TABS
5.0000 mg | ORAL_TABLET | Freq: Every day | ORAL | 0 refills | Status: DC
Start: 2020-08-18 — End: 2020-11-03

## 2020-08-18 NOTE — Telephone Encounter (Signed)
Patient called and left a detailed voice message that she was taking the Amlodipine 10 mg and she started having chest discomfort and went back to taking the Amlodipine 5 mg and feeling much better. Patient does not feel comfortable taking the 10 mg and is asking if OK to stay on the Amlodipine 5 mg? She needs a refill of the 5 mg. Please advise.

## 2020-08-18 NOTE — Telephone Encounter (Signed)
I increased her amlodipine dose to 10 mg daily because her blood pressure was not controlled.  If she is not able to tolerate the amlodipine 10 mg tablet, she should try taking amlodipine 5 mg tablet twice a day and I have sent a prescription for amlodipine 5 mg tablets now.  Her blood pressure needs to be better controlled so I would recommend she take amlodipine 5 mg twice a day.

## 2020-08-18 NOTE — Telephone Encounter (Signed)
Called patient and gave her the message. Patient verbalized an understanding and will let us know if she has any changes. Patient stated that she has lost both of her sisters and her husband within 2 months of each other. Patient is really having a hard time dealing with her loss.

## 2020-08-26 ENCOUNTER — Telehealth: Payer: Self-pay | Admitting: Allergy

## 2020-08-26 ENCOUNTER — Ambulatory Visit: Payer: 59 | Admitting: Obstetrics and Gynecology

## 2020-08-26 DIAGNOSIS — T7800XA Anaphylactic reaction due to unspecified food, initial encounter: Secondary | ICD-10-CM

## 2020-08-26 DIAGNOSIS — Z91018 Allergy to other foods: Secondary | ICD-10-CM

## 2020-08-26 NOTE — Telephone Encounter (Signed)
Left a message letting patient know that May is perfect timing for her next Alpha Gal panel to be drawn and she can come in the first week of May to have it drawn.  Order is placed

## 2020-08-26 NOTE — Telephone Encounter (Signed)
Patient had to cancel her appointment for Friday, 08/28/20 because her kids are out of school for the day. She rescheduled for 10/23/20. She wants to know if she can come in and get blood work for alpha gal before that appointment so Dr. Nelva Bush will already have the results back in time for her appointment.

## 2020-08-26 NOTE — Telephone Encounter (Signed)
That will actually perfect timing.  Her last alpha gal testing was done in May 2021.  Thus we can order her alpha gal panel so that she can call in the first week of May to have that lab drawn.

## 2020-08-26 NOTE — Telephone Encounter (Signed)
Please advise 

## 2020-08-28 ENCOUNTER — Ambulatory Visit: Payer: 59 | Admitting: Allergy

## 2020-09-03 ENCOUNTER — Encounter (INDEPENDENT_AMBULATORY_CARE_PROVIDER_SITE_OTHER): Payer: Self-pay | Admitting: Nurse Practitioner

## 2020-09-03 ENCOUNTER — Other Ambulatory Visit: Payer: Self-pay

## 2020-09-03 ENCOUNTER — Ambulatory Visit (INDEPENDENT_AMBULATORY_CARE_PROVIDER_SITE_OTHER): Payer: 59 | Admitting: Obstetrics and Gynecology

## 2020-09-03 ENCOUNTER — Telehealth (INDEPENDENT_AMBULATORY_CARE_PROVIDER_SITE_OTHER): Payer: 59 | Admitting: Nurse Practitioner

## 2020-09-03 ENCOUNTER — Encounter: Payer: Self-pay | Admitting: Obstetrics and Gynecology

## 2020-09-03 VITALS — BP 122/78 | Ht 67.0 in | Wt 217.0 lb

## 2020-09-03 DIAGNOSIS — Z01419 Encounter for gynecological examination (general) (routine) without abnormal findings: Secondary | ICD-10-CM

## 2020-09-03 DIAGNOSIS — R490 Dysphonia: Secondary | ICD-10-CM | POA: Diagnosis not present

## 2020-09-03 MED ORDER — FLUTICASONE PROPIONATE 50 MCG/ACT NA SUSP: 2.0000 | Freq: Every day | NASAL | 6 refills | Status: DC

## 2020-09-03 NOTE — Progress Notes (Signed)
60 y.o. G32P1021 Widowed [place] American female here for annual exam.     Husband passed 05/2020 from Covid pneumonia.  Two sisters passed from Covid also.   Had Covid in December.  Tested negative for Covid in February.   Hoarse voice today.   PCP:  Hurshel Party, [id number]   Patient's last menstrual period was 07/07/2010.           Sexually active: No.  The current method of family planning is post menopausal status.    Exercising: No.   Smoker:  no  Health Maintenance: Pap:  08-03-18 Neg:Neg HR HPV, 06-15-15 Neg:Neg HR HPV History of abnormal Pap:  no MMG: 02-05-20 3D/Neg/BiRads1  Colonoscopy:  NEVER BMD: 02-11-14  Result :Normal TDaP:  PCP Gardasil:   no HIV:no Hep C:no Screening Labs:  PCP.   reports that she has never smoked. She has never used smokeless tobacco. She reports that she does not drink alcohol and does not use drugs.  Past Medical History:  Diagnosis Date  . Allergy to alpha-gal 10/2018  . Blood transfusion without reported diagnosis    age 37  . Fibroid   . Foraminal stenosis of cervical region 2016  . Hypertension   . Hypothyroidism   . PCOS (polycystic ovarian syndrome) 12/31/2019  . Vitamin D deficiency     Past Surgical History:  Procedure Laterality Date  . CESAREAN SECTION  1989    Current Outpatient Medications  Medication Sig Dispense Refill  . amLODipine (NORVASC) 5 MG tablet Take 1 tablet (5 mg total) by mouth daily. 90 tablet 0  . ARMOUR THYROID 90 MG tablet TAKE ONE TABLET (90MG  TOTAL) BY MOUTH DAILY 30 tablet 3  . bisoprolol-hydrochlorothiazide (ZIAC) 5-6.25 MG tablet Take 1 tablet by mouth daily. 90 tablet 1  . Cholecalciferol (VITAMIN D3) 125 MCG (5000 UT) CAPS Take 2 capsules by mouth daily.     Marland Kitchen EPINEPHrine (EPIPEN JR) 0.15 MG/0.3ML injection     . furosemide (LASIX) 40 MG tablet TAKE ONE TABLET (40MG  TOTAL) BY MOUTH DAILY AS NEEDED 90 tablet 0  . Multiple Vitamin (MULTIVITAMIN) tablet Take 1 tablet by mouth daily.    Marland Kitchen  triamcinolone cream (KENALOG) 0.5 % Apply 1 application topically 3 (three) times daily. 30 g 0  . TROKENDI XR 25 MG CP24 Take 1 tablet by mouth daily.     No current facility-administered medications for this visit.    Family History  Problem Relation Age of Onset  . Breast cancer Sister 82  . Hypertension Sister   . Hypertension Mother   . Thyroid disease Mother        thyroidectomy  . Hypertension Father   . Stroke Maternal Grandfather   . Hypertension Sister   . Hypertension Sister   . Hypertension Sister   . Hypertension Sister     Review of Systems  HENT:       Laryngitis  All other systems reviewed and are negative.   Exam:   BP 122/78 (Cuff Size: Large)   Ht 5\' 7"  (1.702 m)   Wt 217 lb (98.4 kg)   LMP 07/07/2010   SpO2 99%   BMI 33.99 kg/m     General appearance: alert, cooperative and appears stated age Head: normocephalic, without obvious abnormality, atraumatic Neck: no adenopathy, supple, symmetrical, trachea midline and thyroid normal to inspection and palpation Lungs: clear to auscultation bilaterally Breasts: normal appearance, no masses or tenderness, No nipple retraction or dimpling, No nipple discharge or bleeding, No axillary adenopathy  Heart: regular rate and rhythm Abdomen: soft, non-tender; no masses, no organomegaly Extremities: extremities normal, atraumatic, no cyanosis or edema Skin: skin color, texture, turgor normal. No rashes or lesions Lymph nodes: cervical, supraclavicular, and axillary nodes normal. Neurologic: grossly normal  Pelvic: External genitalia:  no lesions              No abnormal inguinal nodes palpated.              Urethra:  normal appearing urethra with no masses, tenderness or lesions              Bartholins and Skenes: normal                 Vagina: normal appearing vagina with normal color and discharge, no lesions              Cervix: no lesions              Pap taken: No. Bimanual Exam:  Uterus:  normal size,  contour, position, consistency, mobility, non-tender              Adnexa: no mass, fullness, tenderness              Rectal exam: Yes.  .  Confirms.              Anus:  normal sphincter tone, no lesions  Chaperone was present for exam.  Assessment:   Well woman visit with normal exam. FH of breast cancer.  Hypothyroidism.  Menopausal female. Bereavement.   Plan: Mammogram screening discussed. Self breast awareness reviewed. Pap and HR HPV as above. Guidelines for Calcium, Vitamin D, regular exercise program including cardiovascular and weight bearing exercise. She may check benefits for Cologuard testing.  I offered to order this for her if desired. Bereavement support given.  I suggested possible counseling through Hospice.  Follow up annually and prn.

## 2020-09-03 NOTE — Patient Instructions (Signed)

## 2020-09-03 NOTE — Progress Notes (Signed)
An audio-only tele-health visit was conducted today. I connected with  Jessica Mooney on 09/03/20 utilizing audio-only technology and verified that I am speaking with the correct person using two identifiers. The patient was located at their place of employment, and I was located at home during the encounter. I discussed the limitations of evaluation and management by telemedicine. The patient expressed understanding and agreed to proceed.   Subjective:  Patient ID: Jessica Mooney, female    DOB: [birth year]  Age: 60 y.o. MRN: [id number]  CC:  Chief Complaint  Patient presents with  . Hoarse      HPI  She is experiencing hoarseness that she reports started yesterday.  She does not recall having a recent upper respiratory infection but does tell me she was experiencing some coughing yesterday.  She does not remember being exposed to anybody who has been sick.  She is wondering what she can take for the treatment of her hoarseness and nasal congestion.  She does have a history of allergic rhinitis and wants to make sure that she can take some medicine that will not interact with her blood pressure medication.  Past Medical History:  Diagnosis Date  . Allergy to alpha-gal 10/2018  . Blood transfusion without reported diagnosis    age 72  . Fibroid   . Foraminal stenosis of cervical region 2016  . Hypertension   . Hypothyroidism   . PCOS (polycystic ovarian syndrome) 12/31/2019  . Vitamin D deficiency       Family History  Problem Relation Age of Onset  . Breast cancer Sister 81  . Hypertension Sister   . Hypertension Mother   . Thyroid disease Mother        thyroidectomy  . Hypertension Father   . Stroke Maternal Grandfather   . Hypertension Sister   . Hypertension Sister   . Hypertension Sister   . 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.   Social History   Tobacco Use  . Smoking status: Never Smoker  . Smokeless tobacco: Never Used  Substance Use Topics  . Alcohol use: No    Alcohol/week: 0.0 standard drinks     Current Meds  Medication Sig  . amLODipine (NORVASC) 5 MG tablet Take 1 tablet (5 mg total) by mouth daily.  Francia Greaves THYROID 90 MG tablet TAKE ONE TABLET (90MG  TOTAL) BY MOUTH DAILY  . bisoprolol-hydrochlorothiazide (ZIAC) 5-6.25 MG tablet Take 1 tablet by mouth daily.  . Cholecalciferol (VITAMIN D3) 125 MCG (5000 UT) CAPS Take 2 capsules by mouth daily.   Marland Kitchen EPINEPHrine (EPIPEN JR) 0.15 MG/0.3ML injection   . fluticasone (FLONASE) 50 MCG/ACT nasal spray Place 2 sprays into both nostrils daily.  . furosemide (LASIX) 40 MG tablet TAKE ONE TABLET (40MG  TOTAL) BY MOUTH DAILY AS NEEDED  . Multiple Vitamin (MULTIVITAMIN) tablet Take 1 tablet by mouth daily.  Marland Kitchen triamcinolone cream (KENALOG) 0.5 % Apply 1 application topically 3 (three) times daily.  Marland Kitchen TROKENDI XR 25 MG CP24 Take 1 tablet by mouth daily.    ROS:  Review of Systems  Constitutional: Negative for chills and fever.  HENT: Positive for congestion. Negative for sore throat.        (-) sneezing  Respiratory: Positive for cough, sputum production (some early this AM) and shortness of breath.   Cardiovascular: Negative for chest pain.  Neurological:  Negative for headaches.     Objective:   Today's Vitals: LMP 07/07/2010  Vitals with BMI 09/03/2020 07/21/2020 05/11/2020  Height 5\' 7"  5\' 7"  -  Weight 217 lbs 217 lbs 6 oz -  BMI 32.54 98.26 -  Systolic 415 830 (No Data)  Diastolic 78 940 (No Data)  Pulse - 72 -     Physical Exam Comprehensive physical exam not completed today as office visit was conducted remotely.  Patient was very difficult to understand as her voice was very hoarse and very quiet.  She did not appear to be short of breath as it did not appear she had to stop talking in order to take deep breaths.  No coughing was  noted during exam today.  Patient was alert and oriented, and appeared to have appropriate judgment.       Assessment and Plan   1. Hoarseness of voice      Plan: 1.  I did discuss that most likely cause of her hoarseness is acute laryngitis.  For now recommended she focus on symptomatic treatment with plenty of hydration and rest as well as to use over-the-counter Coricidin as needed and Flonase nasal spray for nasal congestion.  She was told that if her symptoms persist over the next 2 to 3 weeks that she needs to either notify us or call her ear nose and throat doctor for further evaluation she is also encouraged to let me know if her symptoms worsen as opposed to improve over the next few days.  She tells me she understands.   Tests ordered No orders of the defined types were placed in this encounter.     Meds ordered this encounter  Medications  . fluticasone (FLONASE) 50 MCG/ACT nasal spray    Sig: Place 2 sprays into both nostrils daily.    Dispense:  16 g    Refill:  6    Order Specific Question:   Supervising Provider    Answer:   Doree Albee [[id number]]    Patient to follow-up as scheduled in May or sooner as needed.  Total time seen on the telephone today was 10 minutes and 25 seconds.  Ailene Ards, NP

## 2020-09-03 NOTE — Patient Instructions (Addendum)
Coricidin - Follow directions on label Flonase Nasal Spray- follow directions on label

## 2020-09-19 ENCOUNTER — Other Ambulatory Visit (INDEPENDENT_AMBULATORY_CARE_PROVIDER_SITE_OTHER): Payer: Self-pay | Admitting: Internal Medicine

## 2020-10-21 ENCOUNTER — Ambulatory Visit (INDEPENDENT_AMBULATORY_CARE_PROVIDER_SITE_OTHER): Payer: 59 | Admitting: Internal Medicine

## 2020-10-23 ENCOUNTER — Ambulatory Visit: Payer: 59 | Admitting: Allergy

## 2020-10-26 LAB — ALPHA-GAL PANEL
Allergen Lamb IgE: 0.17 kU/L — AB
Beef IgE: 0.2 kU/L — AB
IgE (Immunoglobulin E), Serum: 77 IU/mL (ref 6–495)
O215-IgE Alpha-Gal: 0.48 kU/L — AB
Pork IgE: <0.1 kU/L

## 2020-10-29 ENCOUNTER — Other Ambulatory Visit: Payer: Self-pay

## 2020-10-29 ENCOUNTER — Ambulatory Visit (INDEPENDENT_AMBULATORY_CARE_PROVIDER_SITE_OTHER): Payer: 59 | Admitting: Allergy

## 2020-10-29 ENCOUNTER — Encounter: Payer: Self-pay | Admitting: Allergy

## 2020-10-29 VITALS — Ht 67.5 in | Wt 210.0 lb

## 2020-10-29 DIAGNOSIS — Z91018 Allergy to other foods: Secondary | ICD-10-CM

## 2020-10-29 DIAGNOSIS — T7800XD Anaphylactic reaction due to unspecified food, subsequent encounter: Secondary | ICD-10-CM | POA: Diagnosis not present

## 2020-10-29 DIAGNOSIS — T7800XA Anaphylactic reaction due to unspecified food, initial encounter: Secondary | ICD-10-CM

## 2020-10-29 MED ORDER — EPINEPHRINE 0.3 MG/0.3ML IJ SOAJ: 0.3000 mg | Freq: Once | INTRAMUSCULAR | 1 refills | Status: AC

## 2020-10-29 NOTE — Patient Instructions (Addendum)
Alpha gal allergy  - you have detectable IgE to alpha gal on recent lab however it is a lot lower than last year's test.  This is a sign that you may be losing your sensitivity to red meats  - continue avoidance of red meats.     Dairy and gelatin are by-products of red meat animals that can also lead to symptoms with ingestion related to alpha gal allergy.   You also need to be cautious with certain vaccines that can continue pork/gelatin products.    - have access to self-injectable epinephrine (Epipen or AuviQ) 0.3mg  at all times  - follow emergency action plan in case of allergic reaction  - plan to repeat alpha gal panel next year to see if you may become negative to alpha gal IgE  Follow-up 1 year or sooner if needed

## 2020-10-29 NOTE — Progress Notes (Signed)
RE: Jessica Mooney MRN: [id number] DOB: May 15, 1961 Date of Telemedicine Visit: 10/29/2020  Referring provider: Maryruth Hancock, MD Primary care provider: Doree Albee, MD  Chief Complaint: Allergic Reaction (Needs a new epi pen and wanted to go over lab results - she also needs paperwork to take the epi pen on a plane )   Telemedicine Follow Up Visit via Telephone: I connected with [name] for a follow up on 10/29/20 by telephone and verified that I am speaking with the correct person using two identifiers.   I discussed the limitations, risks, security and privacy concerns of performing an evaluation and management service by telephone and the availability of in person appointments. I also discussed with the patient that there may be a patient responsible charge related to this service. The patient expressed understanding and agreed to proceed.  Patient is at home. Provider is at the office.  Visit start time: 1605 Visit end time: [place] consent/check in by: [facility] consent and medical assistant/nurse: Diandra  History of Present Illness: She is a 60 y.o. female, who is being followed for alpha gal allergy. Her previous allergy office visit was on 07/31/19 with Dr. Nelva Bush.   She states she has done well since her last visit without any major health changes, surgeries or hospitalizations.  She continues to avoid red meat.  She states been avoiding red meat for the past 30 years or so.  She has no interest in eating red meat in the future.  She states she has had a couple occasions where she may have had cross-contamination with red meat where she may have felt some nausea or abdominal pain symptoms.  For the most part though she avoids red meat in her diet.  She has not had any need to use her epinephrine device.  She needs a refill today.  She has plans to do international travel in like a airline travel note. She did have her alpha gal panel done prior to this  visit that did show lower IgE levels to alpha gal.  Assessment and Plan: Tuleen is a 60 y.o. female with:   Alpha gal allergy  - you have detectable IgE to alpha gal on recent lab however it is a lot lower than last year's test.  This is a sign that you may be losing your sensitivity to red meats  - continue avoidance of red meats.     Dairy and gelatin are by-products of red meat animals that can also lead to symptoms with ingestion related to alpha gal allergy.   You also need to be cautious with certain vaccines that can continue pork/gelatin products.    - have access to self-injectable epinephrine (Epipen or AuviQ) 0.3mg  at all times  - follow emergency action plan in case of allergic reaction  - plan to repeat alpha gal panel next year to see if you may become negative to alpha gal IgE -provided with letter for plane travel and access to her epinephrine device  Follow-up 1 year or sooner if needed  Diagnostics: Component     Latest Ref Rng & Units 10/20/2020  IgE (Immunoglobulin E), Serum     6 - 495 IU/mL 77  [id number] Alpha-Gal     Class I kU/L 0.48 (A)  Beef IgE     Class 0/I kU/L 0.20 (A)  Pork IgE     Class 0 kU/L <0.10  Allergen Lamb IgE     Class 0/I kU/L 0.17 (A)  Medication List:  Current Outpatient Medications  Medication Sig Dispense Refill  . amLODipine (NORVASC) 5 MG tablet Take 1 tablet (5 mg total) by mouth daily. 90 tablet 0  . ARMOUR THYROID 90 MG tablet TAKE ONE TABLET (90MG  TOTAL) BY MOUTH DAILY 30 tablet 3  . bisoprolol-hydrochlorothiazide (ZIAC) 5-6.25 MG tablet Take 1 tablet by mouth daily. 90 tablet 1  . Cholecalciferol (VITAMIN D3) 125 MCG (5000 UT) CAPS Take 2 capsules by mouth daily.     Marland Kitchen EPINEPHrine (EPIPEN JR) 0.15 MG/0.3ML injection     . EPINEPHrine 0.3 mg/0.3 mL IJ SOAJ injection Inject 0.3 mg into the muscle once for 1 dose. 1 each 1  . fluticasone (FLONASE) 50 MCG/ACT nasal spray Place 2 sprays into both nostrils daily. 16 g 6  .  furosemide (LASIX) 40 MG tablet TAKE ONE TABLET (40MG  TOTAL) BY MOUTH DAILY AS NEEDED 90 tablet 0  . Multiple Vitamin (MULTIVITAMIN) tablet Take 1 tablet by mouth daily.    Marland Kitchen triamcinolone cream (KENALOG) 0.5 % Apply 1 application topically 3 (three) times daily. 30 g 0  . TROKENDI XR 25 MG CP24 Take 1 tablet by mouth daily.     No current facility-administered medications for this visit.   Allergies: Allergies  Allergen Reactions  . Elemental Sulfur   . Other Swelling    Erythromycin ophthalmic ointment  . Sulfa Antibiotics     hives  . Alpha-Gal Diarrhea, Nausea Only and Rash   I reviewed her past medical history, social history, family history, and environmental history and no significant changes have been reported from previous visit on 07/31/19  Review of Systems  Constitutional: Negative.   HENT: Negative.   Eyes: Negative.   Respiratory: Negative.   Cardiovascular: Negative.   Gastrointestinal: Negative.   Musculoskeletal: Negative.   Skin: Negative.   Neurological: Negative.    Objective: Physical Exam Not obtained as encounter was done via telephone.   Previous notes and tests were reviewed.  I discussed the assessment and treatment plan with the patient. The patient was provided an opportunity to ask questions and all were answered. The patient agreed with the plan and demonstrated an understanding of the instructions.   The patient was advised to call back or seek an in-person evaluation if the symptoms worsen or if the condition fails to improve as anticipated.  I provided 10 minutes of non-face-to-face time during this encounter.  It was my pleasure to participate in [place] care today. Please feel free to contact me with any questions or concerns.   Sincerely,  [name], MD

## 2020-10-30 ENCOUNTER — Ambulatory Visit: Payer: 59 | Admitting: Allergy

## 2020-11-03 ENCOUNTER — Ambulatory Visit (INDEPENDENT_AMBULATORY_CARE_PROVIDER_SITE_OTHER): Payer: 59 | Admitting: Internal Medicine

## 2020-11-03 ENCOUNTER — Encounter (INDEPENDENT_AMBULATORY_CARE_PROVIDER_SITE_OTHER): Payer: Self-pay | Admitting: Internal Medicine

## 2020-11-03 ENCOUNTER — Other Ambulatory Visit: Payer: Self-pay

## 2020-11-03 ENCOUNTER — Other Ambulatory Visit (INDEPENDENT_AMBULATORY_CARE_PROVIDER_SITE_OTHER): Payer: Self-pay | Admitting: Internal Medicine

## 2020-11-03 VITALS — BP 120/78 | HR 68 | Temp 96.8°F | Ht 67.0 in | Wt 220.6 lb

## 2020-11-03 DIAGNOSIS — I10 Essential (primary) hypertension: Secondary | ICD-10-CM

## 2020-11-03 DIAGNOSIS — E039 Hypothyroidism, unspecified: Secondary | ICD-10-CM

## 2020-11-03 DIAGNOSIS — E282 Polycystic ovarian syndrome: Secondary | ICD-10-CM

## 2020-11-03 DIAGNOSIS — F4321 Adjustment disorder with depressed mood: Secondary | ICD-10-CM

## 2020-11-03 MED ORDER — ARMOUR THYROID 120 MG PO TABS: 120.0000 mg | ORAL_TABLET | Freq: Every day | ORAL | 3 refills | Status: DC

## 2020-11-03 NOTE — Progress Notes (Signed)
Metrics: Intervention Frequency ACO  Documented Smoking Status Yearly  Screened one or more times in 24 months  Cessation Counseling or  Active cessation medication Past 24 months  Past 24 months   Guideline developer: UpToDate (See UpToDate for funding source) Date Released: 2014       Wellness Office Visit  Subjective:  Patient ID: Jessica Mooney, female    DOB: [birth year]  Age: 60 y.o. MRN: [id number]  CC: This lady comes in for follow-up of PCOS, hypertension, hypothyroidism. HPI  This unfortunate lady has had tragedy in her home with the death of her husband from COVID-42 disease approximately 4 months ago and then the death of 2 sisters in short space of time, both from COVID-56 disease.  She is seeing a therapist once a week.  She tells me that she cries every day but is definitely not suicidal. She has gained weight, she finds that she is eating unhealthy foods late at night and even first thing in the morning. She has been taking Armour Thyroid 90 mg daily. She has tolerated taking amlodipine 5 mg twice a day for her uncontrolled hypertension from the last visit. Past Medical History:  Diagnosis Date  . Allergy to alpha-gal 10/2018  . Blood transfusion without reported diagnosis    age 69  . Eczema   . Fibroid   . Foraminal stenosis of cervical region 2016  . Hypertension   . Hypothyroidism   . PCOS (polycystic ovarian syndrome) 12/31/2019  . Vitamin D deficiency    Past Surgical History:  Procedure Laterality Date  . CESAREAN SECTION  1989     Family History  Problem Relation Age of Onset  . Breast cancer Sister 40  . Hypertension Sister   . Hypertension Mother   . Thyroid disease Mother        thyroidectomy  . Hypertension Father   . Stroke Maternal Grandfather   . Hypertension Sister   . Hypertension Sister   . Hypertension Sister   . 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   Social History   Tobacco Use  . Smoking status: Never Smoker  . Smokeless tobacco: Never Used  Substance Use Topics  . Alcohol use: No    Alcohol/week: 0.0 standard drinks    Current Meds  Medication Sig  . amLODipine (NORVASC) 5 MG tablet Take 1 tablet (5 mg total) by mouth daily. (Patient taking differently: Take 5 mg by mouth 2 (two) times daily.)  . ARMOUR THYROID 120 MG tablet Take 1 tablet (120 mg total) by mouth daily before breakfast.  . bisoprolol-hydrochlorothiazide (ZIAC) 5-6.25 MG tablet Take 1 tablet by mouth daily.  . Cholecalciferol (VITAMIN D3) 125 MCG (5000 UT) CAPS Take 2 capsules by mouth daily.   Marland Kitchen EPINEPHrine 0.3 mg/0.3 mL IJ SOAJ injection Inject into the muscle.  . fluticasone (FLONASE) 50 MCG/ACT nasal spray Place 2 sprays into both nostrils daily.  . furosemide (LASIX) 40 MG tablet TAKE ONE TABLET (40MG  TOTAL) BY MOUTH DAILY AS NEEDED  . Multiple Vitamin (MULTIVITAMIN) tablet Take 1 tablet by mouth daily.  Marland Kitchen triamcinolone cream (KENALOG) 0.5 % Apply 1 application topically 3 (three) times daily.  Marland Kitchen TROKENDI XR 25 MG CP24 Take 1 tablet by mouth daily.  . [DISCONTINUED] ARMOUR THYROID 90 MG tablet TAKE ONE TABLET (90MG  TOTAL) BY MOUTH DAILY     Flowsheet Row  Office Visit from 11/03/2020 in [facility]  PHQ-9 Total Score 0      Objective:   Today's Vitals: BP 120/78   Pulse 68   Temp (!) 96.8 F (36 C) (Temporal)   Ht 5\' 7"  (1.702 m)   Wt 220 lb 9.6 oz (100.1 kg)   LMP 07/07/2010   SpO2 96%   BMI 34.55 kg/m  Vitals with BMI 11/03/2020 10/29/2020 09/03/2020  Height 5\' 7"  5' 7.5" 5\' 7"   Weight 220 lbs 10 oz 210 lbs 217 lbs  BMI 34.54 47.42 59.56  Systolic 387 - 564  Diastolic 78 - 78  Pulse 68 - -     Physical Exam  Although she has gained 10 pounds, her blood pressure is better controlled today.  She is alert and orientated.     Assessment   1. PCOS (polycystic  ovarian syndrome)   2. Essential hypertension   3. Acquired hypothyroidism   4. Grief reaction       Tests ordered No orders of the defined types were placed in this encounter.    Plan: 1. She will continue with current antihypertensive therapy which is controlling her blood pressure well. 2. As far as her hypothyroid is concerned, I think we can optimize further and I have sent a new prescription of Armour Thyroid 120 mg daily.  Hopefully, this will actually make her feel better overall as well as her mental state. 3. I have told her that should she require something for grieving/sadness/depression, she should let me know. 4. I will see her in about 2 months time for follow-up.  We will do blood work that   Meds ordered this encounter  Medications  . ARMOUR THYROID 120 MG tablet    Sig: Take 1 tablet (120 mg total) by mouth daily before breakfast.    Dispense:  30 tablet    Refill:  3    Aliscia Clayton Luther Parody, MD

## 2020-11-23 ENCOUNTER — Other Ambulatory Visit: Payer: Self-pay | Admitting: Obstetrics and Gynecology

## 2020-11-23 DIAGNOSIS — Z1231 Encounter for screening mammogram for malignant neoplasm of breast: Secondary | ICD-10-CM

## 2020-12-03 ENCOUNTER — Other Ambulatory Visit (INDEPENDENT_AMBULATORY_CARE_PROVIDER_SITE_OTHER): Payer: Self-pay | Admitting: Internal Medicine

## 2020-12-21 ENCOUNTER — Ambulatory Visit (INDEPENDENT_AMBULATORY_CARE_PROVIDER_SITE_OTHER): Payer: 59 | Admitting: Internal Medicine

## 2020-12-21 ENCOUNTER — Other Ambulatory Visit: Payer: Self-pay

## 2020-12-21 ENCOUNTER — Encounter (INDEPENDENT_AMBULATORY_CARE_PROVIDER_SITE_OTHER): Payer: Self-pay | Admitting: Internal Medicine

## 2020-12-21 VITALS — BP 126/84 | HR 52 | Temp 96.8°F | Ht 67.0 in | Wt 220.6 lb

## 2020-12-21 DIAGNOSIS — I10 Essential (primary) hypertension: Secondary | ICD-10-CM

## 2020-12-21 DIAGNOSIS — R5381 Other malaise: Secondary | ICD-10-CM

## 2020-12-21 DIAGNOSIS — R232 Flushing: Secondary | ICD-10-CM

## 2020-12-21 DIAGNOSIS — E039 Hypothyroidism, unspecified: Secondary | ICD-10-CM | POA: Diagnosis not present

## 2020-12-21 DIAGNOSIS — E282 Polycystic ovarian syndrome: Secondary | ICD-10-CM

## 2020-12-21 DIAGNOSIS — R5383 Other fatigue: Secondary | ICD-10-CM

## 2020-12-21 LAB — COMPLETE METABOLIC PANEL WITH GFR
AG Ratio: 1.4 (calc) (ref 1.0–2.5)
ALT: 9 U/L (ref 6–29)
AST: 14 U/L (ref 10–35)
Albumin: 4.4 g/dL (ref 3.6–5.1)
Alkaline phosphatase (APISO): 67 U/L (ref 37–153)
BUN/Creatinine Ratio: 15 (calc) (ref 6–22)
BUN: 16 mg/dL (ref 7–25)
CO2: 30 mmol/L (ref 20–32)
Calcium: 10.1 mg/dL (ref 8.6–10.4)
Chloride: 102 mmol/L (ref 98–110)
Creat: 1.09 mg/dL — ABNORMAL HIGH (ref 0.50–1.03)
Globulin: 3.2 g/dL (calc) (ref 1.9–3.7)
Glucose, Bld: 82 mg/dL (ref 65–99)
Potassium: 4 mmol/L (ref 3.5–5.3)
Sodium: 139 mmol/L (ref 135–146)
Total Bilirubin: 0.5 mg/dL (ref 0.2–1.2)
Total Protein: 7.6 g/dL (ref 6.1–8.1)
eGFR: 59 mL/min/{1.73_m2} — ABNORMAL LOW (ref >=60)

## 2020-12-21 LAB — T3, FREE: T3, Free: 4.6 pg/mL — ABNORMAL HIGH (ref 2.3–4.2)

## 2020-12-21 LAB — TSH: TSH: 0.38 mIU/L — ABNORMAL LOW (ref 0.40–4.50)

## 2020-12-21 MED ORDER — AMLODIPINE BESYLATE 5 MG PO TABS: 5.0000 mg | ORAL_TABLET | Freq: Two times a day (BID) | ORAL | 1 refills | Status: AC

## 2020-12-21 MED ORDER — FUROSEMIDE 40 MG PO TABS: 40.0000 mg | ORAL_TABLET | Freq: Every day | ORAL | 0 refills | Status: AC | PRN

## 2020-12-21 NOTE — Progress Notes (Signed)
Metrics: Intervention Frequency ACO  Documented Smoking Status Yearly  Screened one or more times in 24 months  Cessation Counseling or  Active cessation medication Past 24 months  Past 24 months   Guideline developer: UpToDate (See UpToDate for funding source) Date Released: 2014       Wellness Office Visit  Subjective:  Patient ID: Jessica Mooney, female    DOB: 04-18-1961  Age: 60 y.o. MRN: [id number]  CC: This lady comes in for follow-up of hypothyroidism, hypertension, PCOS. HPI  She has tolerated the higher dose of Armour Thyroid 120 mg daily.  She does not appreciate any major benefit with the higher dose. She is still in the grieving process through the loss of her husband.  She tells me that she had known her husband for 71 years since the age of 57.  This is a big loss for her in addition to the loss of her sisters. In addition, she told me that she lost her daughter at the age of 15. She continues on amlodipine and Ziac for her hypertension. Past Medical History:  Diagnosis Date   Allergy to alpha-gal 10/2018   Blood transfusion without reported diagnosis    age 15   Eczema    Fibroid    Foraminal stenosis of cervical region 2016   Hypertension    Hypothyroidism    PCOS (polycystic ovarian syndrome) 12/31/2019   Vitamin D deficiency    Past Surgical History:  Procedure Laterality Date   CESAREAN SECTION  1989     Family History  Problem Relation Age of Onset   Breast cancer Sister 76   Hypertension Sister    Hypertension Mother    Thyroid disease Mother        thyroidectomy   Hypertension Father    Stroke Maternal Grandfather    Hypertension Sister    Hypertension Sister    Hypertension Sister    Hypertension Sister     Social History   Social History Narrative   Widow since 05/18/2020-was married for 32 years..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   Social History   Tobacco Use   Smoking status: Never   Smokeless  tobacco: Never  Substance Use Topics   Alcohol use: No    Alcohol/week: 0.0 standard drinks    Current Meds  Medication Sig   ARMOUR THYROID 120 MG tablet Take 1 tablet (120 mg total) by mouth daily before breakfast.   bisoprolol-hydrochlorothiazide (ZIAC) 5-6.25 MG tablet Take 1 tablet by mouth daily.   Cholecalciferol (VITAMIN D3) 125 MCG (5000 UT) CAPS Take 2 capsules by mouth daily.    EPINEPHrine 0.3 mg/0.3 mL IJ SOAJ injection Inject into the muscle.   fluticasone (FLONASE) 50 MCG/ACT nasal spray Place 2 sprays into both nostrils daily.   Multiple Vitamin (MULTIVITAMIN) tablet Take 1 tablet by mouth daily.   triamcinolone cream (KENALOG) 0.5 % Apply 1 application topically 3 (three) times daily.   [DISCONTINUED] amLODipine (NORVASC) 5 MG tablet Take 1 tablet (5 mg total) by mouth 2 (two) times daily.   [DISCONTINUED] furosemide (LASIX) 40 MG tablet TAKE ONE TABLET (40MG  TOTAL) BY MOUTH DAILY AS NEEDED     Flowsheet Row Office Visit from 11/03/2020 in [facility]  PHQ-9 Total Score 0       Objective:   Today's Vitals: BP 126/84   Pulse (!) 52   Temp (!) 96.8 F (36 C) (Temporal)   Ht 5\' 7"  (1.702 m)   Wt 220 lb 9.6  oz (100.1 kg)   LMP 07/07/2010   SpO2 99%   BMI 34.55 kg/m  Vitals with BMI 12/21/2020 11/03/2020 10/29/2020  Height 5\' 7"  5\' 7"  5' 7.5"  Weight 220 lbs 10 oz 220 lbs 10 oz 210 lbs  BMI 34.54 56.25 63.89  Systolic 373 428 -  Diastolic 84 78 -  Pulse 52 68 -     Physical Exam She remains obese.  Blood pressure is acceptable.  She is tearful at times during the visit.      Assessment   1. PCOS (polycystic ovarian syndrome)   2. Acquired hypothyroidism   3. Essential hypertension   4. Hot flashes   5. Malaise and fatigue       Tests ordered Orders Placed This Encounter  Procedures   COMPLETE METABOLIC PANEL WITH GFR   T3, free   TSH      Plan: 1.  Continue with antihypertensive medications and we will check kidney  function. 2.  Continue with Armour Thyroid 120 mg daily and we will check thyroid function. 3.  She did have a history of hot flashes which I think she does to to an extent and we have discussed bioidentical hormone therapy again today and she will think about it. 4.  I will see her in about 6 months for an annual physical exam and further recommendations will depend on blood results.    Meds ordered this encounter  Medications   amLODipine (NORVASC) 5 MG tablet    Sig: Take 1 tablet (5 mg total) by mouth 2 (two) times daily.    Dispense:  180 tablet    Refill:  1   furosemide (LASIX) 40 MG tablet    Sig: Take 1 tablet (40 mg total) by mouth daily as needed.    Dispense:  90 tablet    Refill:  0     Shyloh Krinke Luther Parody, MD

## 2020-12-28 ENCOUNTER — Ambulatory Visit
Admission: EM | Admit: 2020-12-28 | Discharge: 2020-12-28 | Disposition: A | Discharge status: Home / Self Care | Payer: 59 | Attending: Family Medicine | Admitting: Family Medicine

## 2020-12-28 ENCOUNTER — Telehealth (INDEPENDENT_AMBULATORY_CARE_PROVIDER_SITE_OTHER): Payer: Self-pay

## 2020-12-28 ENCOUNTER — Encounter: Payer: Self-pay | Admitting: Emergency Medicine

## 2020-12-28 DIAGNOSIS — Z1152 Encounter for screening for COVID-19: Secondary | ICD-10-CM

## 2020-12-28 DIAGNOSIS — J209 Acute bronchitis, unspecified: Secondary | ICD-10-CM

## 2020-12-28 MED ORDER — PREDNISONE 20 MG PO TABS: 40.0000 mg | ORAL_TABLET | Freq: Every day | ORAL | 0 refills | Status: DC

## 2020-12-28 MED ORDER — DOXYCYCLINE HYCLATE 100 MG PO CAPS: 100.0000 mg | ORAL_CAPSULE | Freq: Two times a day (BID) | ORAL | 0 refills | Status: DC

## 2020-12-28 NOTE — Telephone Encounter (Signed)
Patient called and stated that she is having discomfort when she coughs and her back hurts and she started having symptoms on Saturday and had traveled out of town and started coughing and concerned. I advised for patient to go to [facility] to be seen and evaluated. Patient verbalized an understanding. Sending as Juluis Rainier.

## 2020-12-28 NOTE — ED Triage Notes (Signed)
Pt here after flying on Saturday with SOB and central chest pain that radiates to her back. States a nagging cough.

## 2020-12-28 NOTE — Discharge Instructions (Addendum)
Your COVID 19 results should result within 2-4 days. Negative results are immediately resulted to Mychart. Positive results will receive a follow-up call from our clinic. If symptoms are present, I recommend home quarantine until results are known.  Alternate Tylenol and ibuprofen as needed for body aches and fever.  Symptom management per recommendations discussed today.  If any breathing difficulty or chest pain develops go immediately to the closest emergency department for evaluation.

## 2020-12-28 NOTE — ED Provider Notes (Signed)
[facility]    CSN: [id number] Arrival date & time: 12/28/20  1147      History   Chief Complaint Chief Complaint  Patient presents with   Chest Pain   Shortness of Breath    HPI Jessica Mooney is a 60 y.o. female.   HPI Patient presents with URI symptoms including cough, nasal drainage, and  shortness of breath.  Reports back and chest pain along with a dry cough x 3 days. Cough is non-productive. No sore throat. No known COVID exposure.  Recent travel out of state via airlines.  Endorses intermittent shortness of breath Chest pain and back pain is only present with certain movements or when lifting heavy object.  She endorses that she was running through the airport with a heavy bag and suspects this may have attributed to the development of the back pain. Past Medical History:  Diagnosis Date   Allergy to alpha-gal 10/2018   Blood transfusion without reported diagnosis    age 44   Eczema    Fibroid    Foraminal stenosis of cervical region 2016   Hypertension    Hypothyroidism    PCOS (polycystic ovarian syndrome) 12/31/2019   Vitamin D deficiency     Patient Active Problem List   Diagnosis Date Noted   PCOS (polycystic ovarian syndrome) 12/31/2019   Acute bacterial conjunctivitis of both eyes 08/01/2019   Other microscopic hematuria 04/27/2019   Hypertension 04/24/2019   Hypothyroidism 04/24/2019   Hyperlipidemia 04/24/2019   Chloasma 07/11/2018   Contact dermatitis and other eczema due to other chemical products 07/11/2018   Foot fracture 04/04/2013    Past Surgical History:  Procedure Laterality Date   CESAREAN SECTION  1989    OB History     Gravida  4   Para  2   Term  1   Preterm  0   AB  2   Living  1      SAB  0   IAB  0   Ectopic  0   Multiple  0   Live Births  1            Home Medications    Prior to Admission medications   Medication Sig Start Date End Date Taking? Authorizing Provider  doxycycline  (VIBRAMYCIN) 100 MG capsule Take 1 capsule (100 mg total) by mouth 2 (two) times daily. 12/28/20  Yes Scot Jun, FNP  predniSONE (DELTASONE) 20 MG tablet Take 2 tablets (40 mg total) by mouth daily with breakfast. 12/28/20  Yes Scot Jun, FNP  amLODipine (NORVASC) 5 MG tablet Take 1 tablet (5 mg total) by mouth 2 (two) times daily. 12/21/20   Doree Albee, MD  ARMOUR THYROID 120 MG tablet Take 1 tablet (120 mg total) by mouth daily before breakfast. 11/03/20   Doree Albee, MD  bisoprolol-hydrochlorothiazide (ZIAC) 5-6.25 MG tablet Take 1 tablet by mouth daily. 03/19/20   Doree Albee, MD  Cholecalciferol (VITAMIN D3) 125 MCG (5000 UT) CAPS Take 2 capsules by mouth daily.     [provider]  EPINEPHrine 0.3 mg/0.3 mL IJ SOAJ injection Inject into the muscle. 10/29/20   [provider]  fluticasone (FLONASE) 50 MCG/ACT nasal spray Place 2 sprays into both nostrils daily. 09/03/20   Ailene Ards, NP  furosemide (LASIX) 40 MG tablet Take 1 tablet (40 mg total) by mouth daily as needed. 12/21/20   Doree Albee, MD  Multiple Vitamin (MULTIVITAMIN) tablet  Take 1 tablet by mouth daily.    [provider]  triamcinolone cream (KENALOG) 0.5 % Apply 1 application topically 3 (three) times daily. 02/04/20   Doree Albee, MD  TROKENDI XR 25 MG CP24 Take 1 tablet by mouth daily. Patient not taking: Reported on 12/21/2020 05/21/15   [provider]    Family History Family History  Problem Relation Age of Onset   Breast cancer Sister 58   Hypertension Sister    Hypertension Mother    Thyroid disease Mother        thyroidectomy   Hypertension Father    Stroke Maternal Grandfather    Hypertension Sister    Hypertension Sister    Hypertension Sister    Hypertension Sister     Social History Social History   Tobacco Use   Smoking status: Never   Smokeless tobacco: Never  Vaping Use   Vaping Use: Never used  Substance Use  Topics   Alcohol use: No    Alcohol/week: 0.0 standard drinks   Drug use: No     Allergies   Elemental sulfur, Other, Sulfa antibiotics, and Alpha-gal   Review of Systems Review of Systems Pertinent negatives listed in HPI  Physical Exam Triage Vital Signs ED Triage Vitals  Enc Vitals Group     BP 12/28/20 1242 132/76     Pulse Rate 12/28/20 1242 69     Resp 12/28/20 1242 18     Temp 12/28/20 1242 99 F (37.2 C)     Temp Source 12/28/20 1242 Oral     SpO2 12/28/20 1242 95 %     Weight --      Height --      Head Circumference --      Peak Flow --      Pain Score 12/28/20 1243 5     Pain Loc --      Pain Edu? --      Excl. in [place]? --    No data found.  Updated Vital Signs BP 132/76   Pulse 69   Temp 99 F (37.2 C) (Oral)   Resp 18   LMP 07/07/2010   SpO2 95%   Visual Acuity Right Eye Distance:   Left Eye Distance:   Bilateral Distance:    Right Eye Near:   Left Eye Near:    Bilateral Near:     Physical Exam General appearance: alert, Ill-appearing, no distress Head: Normocephalic, without obvious abnormality, atraumatic ENT: Ears normal, nares w/ mucosal edema, congestion, oropharynx w/o erythema Respiratory: Respirations even , unlabored, coarse lung sound, expiratory wheeze Heart: rate and rhythm normal. No gallop or murmurs noted on exam  Abdomen: BS +, no distention, no rebound tenderness, or no mass Extremities: No gross deformities Skin: Skin color, texture, turgor normal. No rashes seen  Psych: Appropriate mood and affect. Neurologic: GCS 15, normal coordination normal gait UC Treatments / Results  Labs (all labs ordered are listed, but only abnormal results are displayed) Labs Reviewed  NOVEL CORONAVIRUS, NAA    EKG   Radiology No results found.  Procedures Procedures (including critical care time)  Medications Ordered in UC Medications - No data to display  Initial Impression / Assessment and Plan / UC Course  I have  reviewed the triage vital signs and the nursing notes.  Pertinent labs & imaging results that were available during my care of the patient were reviewed by me and considered in my medical decision making (see chart for details).  Treating for acute bronchitis however given recent travel and current symptoms recommend COVID 19 testing. COVID/Flu test pending. Symptom management warranted only.  Manage fever with Tylenol and ibuprofen.  Nasal symptoms with over-the-counter antihistamines recommended.  Treatment per discharge medications/discharge instructions.  Red flags/ER precautions given. The most current CDC isolation/quarantine recommendation advised.   Final Clinical Impressions(s) / UC Diagnoses   Final diagnoses:  Encounter for screening for COVID-19  Acute bronchitis, unspecified organism     Discharge Instructions      Your COVID 19 results should result within 2-4 days. Negative results are immediately resulted to Mychart. Positive results will receive a follow-up call from our clinic. If symptoms are present, I recommend home quarantine until results are known.  Alternate Tylenol and ibuprofen as needed for body aches and fever.  Symptom management per recommendations discussed today.  If any breathing difficulty or chest pain develops go immediately to the closest emergency department for evaluation.      ED Prescriptions     Medication Sig Dispense Auth. Provider   doxycycline (VIBRAMYCIN) 100 MG capsule Take 1 capsule (100 mg total) by mouth 2 (two) times daily. 20 capsule Scot Jun, FNP   predniSONE (DELTASONE) 20 MG tablet Take 2 tablets (40 mg total) by mouth daily with breakfast. 10 tablet Scot Jun, FNP      PDMP not reviewed this encounter.   Scot Jun, FNP 12/28/20 1414

## 2020-12-29 LAB — NOVEL CORONAVIRUS, NAA: SARS-CoV-2, NAA: NOT DETECTED

## 2020-12-29 LAB — SARS-COV-2, NAA 2 DAY TAT

## 2021-01-14 ENCOUNTER — Telehealth (INDEPENDENT_AMBULATORY_CARE_PROVIDER_SITE_OTHER): Payer: Self-pay

## 2021-01-14 DIAGNOSIS — I10 Essential (primary) hypertension: Secondary | ICD-10-CM

## 2021-01-14 DIAGNOSIS — E039 Hypothyroidism, unspecified: Secondary | ICD-10-CM

## 2021-01-14 MED ORDER — BISOPROLOL-HYDROCHLOROTHIAZIDE 5-6.25 MG PO TABS: 1.0000 | ORAL_TABLET | Freq: Every day | ORAL | 1 refills | Status: DC

## 2021-01-14 MED ORDER — THYROID 90 MG PO TABS: 90.0000 mg | ORAL_TABLET | Freq: Every day | ORAL | 0 refills | Status: DC

## 2021-01-14 NOTE — Telephone Encounter (Signed)
Patient called and stated that she needs a refill on the following medication:  ARMOUR THYROID 120 MG tablet  Last filled 11/03/2020, # 30 with 3 refills  Also may need a refill on this one also:  bisoprolol-hydrochlorothiazide (ZIAC) 5-6.25 MG tablet  Last filled 03/19/2020, # 90 with 1

## 2021-01-14 NOTE — Telephone Encounter (Signed)
Based on patient's chart review I think we need to reduce her Armour Thyroid dose from 120 down to 90 mg daily.  I have sent a prescription reflecting this dosage change.  Make sure she knows she needs to follow-up with a new primary care provider or I can refer her to endocrinology so she can have close monitoring of her thyroid hormone levels and blood work repeated in 6 to 8 weeks.  Let me know if she needs referral to endocrinology.  Thank you.

## 2021-01-18 NOTE — Telephone Encounter (Signed)
Called patient and left a detailed voice message with the message from Judson Roch.

## 2021-02-09 ENCOUNTER — Ambulatory Visit
Admission: RE | Admit: 2021-02-09 | Discharge: 2021-02-09 | Disposition: A | Discharge status: Home / Self Care | Payer: 59 | Source: Ambulatory Visit | Attending: Obstetrics and Gynecology | Admitting: Obstetrics and Gynecology

## 2021-02-09 ENCOUNTER — Other Ambulatory Visit: Payer: Self-pay

## 2021-02-09 DIAGNOSIS — Z1231 Encounter for screening mammogram for malignant neoplasm of breast: Secondary | ICD-10-CM

## 2021-06-09 ENCOUNTER — Encounter (INDEPENDENT_AMBULATORY_CARE_PROVIDER_SITE_OTHER): Payer: 59 | Admitting: Internal Medicine

## 2021-08-19 LAB — COLOGUARD: COLOGUARD: NEGATIVE

## 2021-08-25 NOTE — Progress Notes (Signed)
61 y.o. G53P1021 Widowed [place] American female here for annual exam.  ? ?No concerns today.  ? ?PCP: Nicholes Rough, PA-C - [facility] ? ?Patient's last menstrual period was 07/07/2010.     ?  ?    ?Sexually active: No.  ?The current method of family planning is post menopausal status.    ?Exercising: Yes.     5-6x week walking ?Smoker:  no ? ?Health Maintenance: ?Pap: 08-03-18 Neg:Neg HR HPV, 06-15-15 Neg:Neg HR HPV ?History of abnormal Pap:  no ?MMG: 02/09/21- neg birads 1 ?Colonoscopy: cologuard 08/11/21-neg, f/u in 3 yrs ?BMD: 02/11/14  Result WNL ?TDaP:  2023 ?Gardasil:   no ?HIV: no ?Hep C: no ?Screening Labs:   PCP ? ? reports that she has never smoked. She has never used smokeless tobacco. She reports that she does not drink alcohol and does not use drugs. ? ?Past Medical History:  ?Diagnosis Date  ? Allergy to alpha-gal 10/2018  ? Blood transfusion without reported diagnosis   ? age 89  ? Eczema   ? Fibroid   ? Foraminal stenosis of cervical region 2016  ? Hypertension   ? Hypothyroidism   ? PCOS (polycystic ovarian syndrome) 12/31/2019  ? Vitamin D deficiency   ? ? ?Past Surgical History:  ?Procedure Laterality Date  ? [place]  ? ? ?Current Outpatient Medications  ?Medication Sig Dispense Refill  ? amLODipine (NORVASC) 5 MG tablet Take 1 tablet (5 mg total) by mouth 2 (two) times daily. 180 tablet 1  ? bisoprolol-hydrochlorothiazide (ZIAC) 5-6.25 MG tablet Take 1 tablet by mouth daily. 90 tablet 1  ? Cholecalciferol (VITAMIN D3) 125 MCG (5000 UT) CAPS Take 2 capsules by mouth daily.     ? EPINEPHrine 0.3 mg/0.3 mL IJ SOAJ injection Inject into the muscle.    ? fluticasone (FLONASE) 50 MCG/ACT nasal spray Place 2 sprays into both nostrils daily. 16 g 6  ? furosemide (LASIX) 40 MG tablet Take 1 tablet (40 mg total) by mouth daily as needed. 90 tablet 0  ? Multiple Vitamin (MULTIVITAMIN) tablet Take 1 tablet by mouth daily.    ? thyroid (ARMOUR THYROID) 90 MG tablet Take 1 tablet (90 mg total) by mouth daily.  90 tablet 0  ? triamcinolone cream (KENALOG) 0.5 % Apply 1 application topically 3 (three) times daily. 30 g 0  ? TROKENDI XR 25 MG CP24 Take 1 tablet by mouth daily.    ? ?No current facility-administered medications for this visit.  ? ? ?Family History  ?Problem Relation Age of Onset  ? Breast cancer Sister 55  ? Hypertension Sister   ? Hypertension Mother   ? Thyroid disease Mother   ?     thyroidectomy  ? Hypertension Father   ? Stroke Maternal Grandfather   ? Hypertension Sister   ? Hypertension Sister   ? Hypertension Sister   ? Hypertension Sister   ? ? ?Review of Systems  ?All other systems reviewed and are negative. ? ?Exam:   ?BP 118/78   Pulse 62   Ht 5' 6.75" (1.695 m)   Wt 215 lb (97.5 kg)   LMP 07/07/2010   SpO2 98%   BMI 33.93 kg/m?     ?General appearance: alert, cooperative and appears stated age ?Head: normocephalic, without obvious abnormality, atraumatic ?Neck: no adenopathy, supple, symmetrical, trachea midline and thyroid normal to inspection and palpation ?Lungs: clear to auscultation bilaterally ?Breasts: normal appearance, no masses or tenderness, No nipple retraction or dimpling, No nipple discharge  or bleeding, No axillary adenopathy ?Heart: regular rate and rhythm ?Abdomen: soft, non-tender; no masses, no organomegaly ?Extremities: extremities normal, atraumatic, no cyanosis or edema ?Skin: skin color, texture, turgor normal. No rashes or lesions ?Lymph nodes: cervical, supraclavicular, and axillary nodes normal. ?Neurologic: grossly normal ? ?Pelvic: External genitalia:  no lesions ?             No abnormal inguinal nodes palpated. ?             Urethra:  normal appearing urethra with no masses, tenderness or lesions ?             Bartholins and Skenes: normal    ?             Vagina: normal appearing vagina with normal color and discharge, no lesions ?             Cervix: no lesions ?             Pap taken: no ?Bimanual Exam:  Uterus:  normal size, contour, position, consistency,  mobility, non-tender ?             Adnexa: no mass, fullness, tenderness ?             Rectal exam: yes.  Confirms. ?             Anus:  normal sphincter tone, no lesions ? ?Chaperone was present for exam:  Lovena Le, CMA ? ?Assessment:   ?Well woman visit with gynecologic exam. ?FH of breast cancer:  sister. ?Hypothyroidism.  ? ?Plan: ?Mammogram screening discussed. ?Self breast awareness reviewed. ?Pap and HR HPV 2025 ?Guidelines for Calcium, Vitamin D, regular exercise program including cardiovascular and weight bearing exercise. ?We discussed Shingrix vaccine.   ?Follow up annually and prn.  ?  ?After visit summary provided.  ? ? ? ?

## 2021-09-08 ENCOUNTER — Encounter: Payer: Self-pay | Admitting: Obstetrics and Gynecology

## 2021-09-08 ENCOUNTER — Ambulatory Visit (INDEPENDENT_AMBULATORY_CARE_PROVIDER_SITE_OTHER): Payer: BC Managed Care – PPO | Admitting: Obstetrics and Gynecology

## 2021-09-08 VITALS — BP 118/78 | HR 62 | Ht 66.75 in | Wt 215.0 lb

## 2021-09-08 DIAGNOSIS — Z01419 Encounter for gynecological examination (general) (routine) without abnormal findings: Secondary | ICD-10-CM | POA: Diagnosis not present

## 2021-09-08 NOTE — Patient Instructions (Addendum)
EXERCISE AND DIET:  We recommended that you start or continue a regular exercise program for good health. Regular exercise means any activity that makes your heart beat faster and makes you sweat.  We recommend exercising at least 30 minutes per day at least 3 days a week, preferably 4 or 5.  We also recommend a diet low in fat and sugar.  Inactivity, poor dietary choices and obesity can cause diabetes, heart attack, stroke, and kidney damage, among others.   ? ?ALCOHOL AND SMOKING:  Women should limit their alcohol intake to no more than 7 drinks/beers/glasses of wine (combined, not each!) per week. Moderation of alcohol intake to this level decreases your risk of breast cancer and liver damage. And of course, no recreational drugs are part of a healthy lifestyle.  And absolutely no smoking or even second hand smoke. Most people know smoking can cause heart and lung diseases, but did you know it also contributes to weakening of your bones? Aging of your skin?  Yellowing of your teeth and nails? ? ?CALCIUM AND VITAMIN D:  Adequate intake of calcium and Vitamin D are recommended.  The recommendations for exact amounts of these supplements seem to change often, but generally speaking 600 mg of calcium (either carbonate or citrate) and 800 units of Vitamin D per day seems prudent. Certain women may benefit from higher intake of Vitamin D.  If you are among these women, your doctor will have told you during your visit.   ? ?PAP SMEARS:  Pap smears, to check for cervical cancer or precancers,  have traditionally been done yearly, although recent scientific advances have shown that most women can have pap smears less often.  However, every woman still should have a physical exam from her gynecologist every year. It will include a breast check, inspection of the vulva and vagina to check for abnormal growths or skin changes, a visual exam of the cervix, and then an exam to evaluate the size and shape of the uterus and  ovaries.  And after 61 years of age, a rectal exam is indicated to check for rectal cancers. We will also provide age appropriate advice regarding health maintenance, like when you should have certain vaccines, screening for sexually transmitted diseases, bone density testing, colonoscopy, mammograms, etc.  ? ?MAMMOGRAMS:  All women over 45 years old should have a yearly mammogram. Many facilities now offer a "3D" mammogram, which may cost around $50 extra out of pocket. If possible,  we recommend you accept the option to have the 3D mammogram performed.  It both reduces the number of women who will be called back for extra views which then turn out to be normal, and it is better than the routine mammogram at detecting truly abnormal areas.   ? ?COLONOSCOPY:  Colonoscopy to screen for colon cancer is recommended for all women at age 18.  We know, you hate the idea of the prep.  We agree, BUT, having colon cancer and not knowing it is worse!!  Colon cancer so often starts as a polyp that can be seen and removed at colonscopy, which can quite literally save your life!  And if your first colonoscopy is normal and you have no family history of colon cancer, most women don't have to have it again for 10 years.  Once every ten years, you can do something that may end up saving your life, right?  We will be happy to help you get it scheduled when you are ready.  Be sure to check your insurance coverage so you understand how much it will cost.  It may be covered as a preventative service at no cost, but you should check your particular policy.   ? ?Calcium Content in Foods ?Calcium is the most abundant mineral in the body. Most of the body's calcium supply is stored in bones and teeth. Calcium helps many parts of the body function normally, including: ?Blood and blood vessels. ?Nerves. ?Hormones. ?Muscles. ?Bones and teeth. ?When your calcium stores are low, you may be at risk for low bone mass, bone loss, and broken bones  (fractures). When you get enough calcium, it helps to support strong bones and teeth throughout your life. ?Calcium is especially important for: ?Children during growth spurts. ?Girls during adolescence. ?Women who are pregnant or breastfeeding. ?Women after their menstrual cycle stops (postmenopause). ?Women whose menstrual cycle has stopped due to anorexia nervosa or regular intense exercise. ?People who cannot eat or digest dairy products. ?Vegans. ?Recommended daily amounts of calcium: ?Women (ages 62 to 27): 1,000 mg per day. ?Women (ages 83 and older): 1,200 mg per day. ?Men (ages 80 to 63): 1,000 mg per day. ?Men (ages 32 and older): 1,200 mg per day. ?Women (ages 43 to 38): 1,300 mg per day. ?Men (ages 18 to 34): 1,300 mg per day. ?General information ?Eat foods that are high in calcium. Try to get most of your calcium from food. ?Some people may benefit from taking calcium supplements. Check with your health care provider or diet and nutrition specialist (dietitian) before starting any calcium supplements. Calcium supplements may interact with certain medicines. Too much calcium may cause other health problems, such as constipation and kidney stones. ?For the body to absorb calcium, it needs vitamin D. Sources of vitamin D include: ?Skin exposure to direct sunlight. ?Foods, such as egg yolks, liver, mushrooms, saltwater fish, and fortified milk. ?Vitamin D supplements. Check with your health care provider or dietitian before starting any vitamin D supplements. ?What foods are high in calcium? ?Foods that are high in calcium contain more than 100 milligrams per serving. ?Fruits ?Fortified orange juice or other fruit juice, 300 mg per 8 oz serving. ?Vegetables ?Collard greens, 360 mg per 8 oz serving. ?Kale, 100 mg per 8 oz serving. ?Bok choy, 160 mg per 8 oz serving. ?Grains ?Fortified ready-to-eat cereals, 100 to 1,000 mg per 8 oz serving. ?Fortified frozen waffles, 200 mg in 2 waffles. ?Oatmeal, 140 mg in 1  cup. ?Meats and other proteins ?Sardines, canned with bones, 325 mg per 3 oz serving. ?Salmon, canned with bones, 180 mg per 3 oz serving. ?Canned shrimp, 125 mg per 3 oz serving. ?Baked beans, 160 mg per 4 oz serving. ?Tofu, firm, made with calcium sulfate, 253 mg per 4 oz serving. ?Dairy ?Yogurt, plain, low-fat, 310 mg per 6 oz serving. ?Nonfat milk, 300 mg per 8 oz serving. ?American cheese, 195 mg per 1 oz serving. ?Cheddar cheese, 205 mg per 1 oz serving. ?Cottage cheese 2%, 105 mg per 4 oz serving. ?Fortified soy, rice, or almond milk, 300 mg per 8 oz serving. ?Mozzarella, part skim, 210 mg per 1 oz serving. ?The items listed above may not be a complete list of foods high in calcium. Actual amounts of calcium may be different depending on processing. Contact a dietitian for more information. ?What foods are lower in calcium? ?Foods that are lower in calcium contain 50 mg or less per serving. ?Fruits ?Apple, about 6 mg. ?Banana, about 12 mg. ?Vegetables ?  Lettuce, 19 mg per 2 oz serving. ?Tomato, about 11 mg. ?Grains ?Rice, 4 mg per 6 oz serving. ?Boiled potatoes, 14 mg per 8 oz serving. ?White bread, 6 mg per slice. ?Meats and other proteins ?Egg, 27 mg per 2 oz serving. ?Red meat, 7 mg per 4 oz serving. ?Chicken, 17 mg per 4 oz serving. ?Fish, cod, or trout, 20 mg per 4 oz serving. ?Dairy ?Cream cheese, regular, 14 mg per 1 Tbsp serving. ?Brie cheese, 50 mg per 1 oz serving. ?Parmesan cheese, 70 mg per 1 Tbsp serving. ?The items listed above may not be a complete list of foods lower in calcium. Actual amounts of calcium may be different depending on processing. Contact a dietitian for more information. ?Summary ?Calcium is an important mineral in the body because it affects many functions. Getting enough calcium helps support strong bones and teeth throughout your life. ?Try to get most of your calcium from food. ?Calcium supplements may interact with certain medicines. Check with your health care provider or  dietitian before starting any calcium supplements. ?This information is not intended to replace advice given to you by your health care provider. Make sure you discuss any questions you have with your hea

## 2021-10-25 ENCOUNTER — Telehealth: Payer: Self-pay | Admitting: Allergy

## 2021-10-25 DIAGNOSIS — Z91018 Allergy to other foods: Secondary | ICD-10-CM

## 2021-10-25 NOTE — Telephone Encounter (Signed)
Patient would like to get labs done for her alpha gal before her visit with Dr. Nelva Bush. Patient does not want to see Dr. Nelva Bush without lab results. Please advise.   Best contact number:  [phone]

## 2021-10-26 NOTE — Telephone Encounter (Signed)
Lm for pt to call us back about this °

## 2021-10-27 NOTE — Telephone Encounter (Signed)
Called and left a message for patient to calllour office back to discuss her lab draw. I will send a copy of her lab orders to her home and also give a copy to [place] so she can have it for when patient comes in office for her follow up with Dr. Nelva Bush.

## 2021-10-28 ENCOUNTER — Other Ambulatory Visit: Payer: Self-pay

## 2021-10-28 ENCOUNTER — Encounter: Payer: Self-pay | Admitting: Allergy

## 2021-10-28 ENCOUNTER — Ambulatory Visit: Payer: BC Managed Care – PPO | Admitting: Allergy

## 2021-10-28 VITALS — BP 126/78 | HR 62 | Temp 98.1°F | Resp 17 | Ht 66.5 in | Wt 216.2 lb

## 2021-10-28 DIAGNOSIS — Z91018 Allergy to other foods: Secondary | ICD-10-CM

## 2021-10-28 MED ORDER — EPINEPHRINE 0.3 MG/0.3ML IJ SOAJ: 0.3000 mg | INTRAMUSCULAR | 1 refills | Status: AC | PRN

## 2021-10-28 NOTE — Progress Notes (Signed)
Follow-up Note  RE: Jessica Mooney MRN: [id number] DOB: Feb 07, 1961 Date of Office Visit: 10/28/2021   History of present illness: Jessica Mooney is a 61 y.o. female presenting today for follow-up of alpha gal allergy.  She was last seen in the office on 10/30/2019 by myself.    She has been avoiding red meat products.  She states she can tolerate dairy products but is now more aware when eating dairy.   She states she can be somewhere where red meat is being cooked like hot dogs and can start to feel different or have abdominal cramp.  She has not needed to use her epinephrine device.  She states she has been more aware that since she had a family member that passed away from anaphylaxis due to shellfish.  Review of systems in the past 4 weeks: Review of Systems  Constitutional: Negative.   HENT: Negative.    Eyes: Negative.   Respiratory: Negative.    Cardiovascular: Negative.   Gastrointestinal: Negative.   Musculoskeletal: Negative.   Skin: Negative.   Allergic/Immunologic: Negative.   Neurological: Negative.     All other systems negative unless noted above in HPI  Past medical/social/surgical/family history have been reviewed and are unchanged unless specifically indicated below.  No changes  Medication List: Current Outpatient Medications  Medication Sig Dispense Refill   amLODipine (NORVASC) 5 MG tablet Take 1 tablet (5 mg total) by mouth 2 (two) times daily. 180 tablet 1   ARMOUR THYROID 120 MG tablet Take 120 mg by mouth every morning.     bisoprolol-hydrochlorothiazide (ZIAC) 5-6.25 MG tablet Take 1 tablet by mouth daily. 90 tablet 1   Cholecalciferol (VITAMIN D3) 125 MCG (5000 UT) CAPS Take 2 capsules by mouth daily.      EPINEPHrine 0.3 mg/0.3 mL IJ SOAJ injection Inject into the muscle.     furosemide (LASIX) 40 MG tablet Take 1 tablet (40 mg total) by mouth daily as needed. 90 tablet 0   Multiple Vitamin (MULTIVITAMIN) tablet Take 1 tablet by mouth daily.      triamcinolone cream (KENALOG) 0.5 % Apply 1 application topically 3 (three) times daily. 30 g 0   fluticasone (FLONASE) 50 MCG/ACT nasal spray Place 2 sprays into both nostrils daily. (Patient not taking: Reported on 10/28/2021) 16 g 6   thyroid (ARMOUR THYROID) 90 MG tablet Take 1 tablet (90 mg total) by mouth daily. (Patient not taking: Reported on 10/28/2021) 90 tablet 0   TROKENDI XR 25 MG CP24 Take 1 tablet by mouth daily. (Patient not taking: Reported on 10/28/2021)     No current facility-administered medications for this visit.     Known medication allergies: Allergies  Allergen Reactions   Codeine     Other reaction(s): Other   Elemental Sulfur    Other Swelling and Other (See Comments)    Erythromycin ophthalmic ointment   Sulfa Antibiotics     hives   Alpha-Gal Diarrhea, Nausea Only and Rash     Physical examination: Blood pressure 126/78, pulse 62, temperature 98.1 F (36.7 C), temperature source Temporal, resp. rate 17, height 5' 6.5" (1.689 m), weight 216 lb 3.2 oz (98.1 kg), last menstrual period 07/07/2010, SpO2 98 %.  General: Alert, interactive, in no acute distress. HEENT: PERRLA, TMs pearly gray, turbinates non-edematous without discharge, post-pharynx non erythematous. Neck: Supple without lymphadenopathy. Lungs: Clear to auscultation without wheezing, rhonchi or rales. {no increased work of breathing. CV: Normal S1, S2 without murmurs. Abdomen: Nondistended, nontender. Skin:  Warm and dry, without lesions or rashes. Extremities:  No clubbing, cyanosis or edema. Neuro:   Grossly intact.  Diagnositics/Labs: None today  Assessment and plan:   Alpha gal allergy  - continue avoidance of red meats.     Dairy and gelatin are by-products of red meat animals that can also lead to symptoms with ingestion related to alpha gal allergy.   You also may need to be cautious with certain vaccines that can continue pork/gelatin products.   - will obtain alpha gal panel  this year to see if levels are rising or falling   - have access to self-injectable epinephrine (Epipen or AuviQ) 0.'3mg'$  at all times  - follow emergency action plan in case of allergic reaction  Follow-up 1 year or sooner if needed  I appreciate the opportunity to take part in Jessica Mooney's care. Please do not hesitate to contact me with questions.  Sincerely,   Prudy Feeler, MD Allergy/Immunology [facility]

## 2021-10-28 NOTE — Patient Instructions (Signed)
Alpha gal allergy  - continue avoidance of red meats.     Dairy and gelatin are by-products of red meat animals that can also lead to symptoms with ingestion related to alpha gal allergy.   You also may need to be cautious with certain vaccines that can continue pork/gelatin products.   - will obtain alpha gal panel this year to see if levels are rising or falling   - have access to self-injectable epinephrine (Epipen or AuviQ) 0.'3mg'$  at all times  - follow emergency action plan in case of allergic reaction  Follow-up 1 year or sooner if needed

## 2021-11-01 LAB — ALPHA-GAL PANEL
Allergen Lamb IgE: <0.1 kU/L
Beef IgE: <0.1 kU/L
IgE (Immunoglobulin E), Serum: 46 IU/mL (ref 6–495)
O215-IgE Alpha-Gal: 0.19 kU/L — AB
Pork IgE: <0.1 kU/L

## 2021-12-27 ENCOUNTER — Other Ambulatory Visit: Payer: Self-pay | Admitting: Obstetrics and Gynecology

## 2021-12-27 DIAGNOSIS — Z1231 Encounter for screening mammogram for malignant neoplasm of breast: Secondary | ICD-10-CM

## 2022-02-10 ENCOUNTER — Ambulatory Visit
Admission: RE | Admit: 2022-02-10 | Discharge: 2022-02-10 | Disposition: A | Discharge status: Home / Self Care | Payer: BC Managed Care – PPO | Source: Ambulatory Visit | Attending: Obstetrics and Gynecology | Admitting: Obstetrics and Gynecology

## 2022-02-10 DIAGNOSIS — Z1231 Encounter for screening mammogram for malignant neoplasm of breast: Secondary | ICD-10-CM

## 2022-09-14 ENCOUNTER — Ambulatory Visit: Payer: BC Managed Care – PPO | Admitting: Obstetrics and Gynecology

## 2022-09-27 NOTE — Progress Notes (Signed)
62 y.o. [id number] Married [place] American female here for annual exam.    No GYN concerns.  PCP:   Ladora Daniel, PA-C  Patient's last menstrual period was 07/07/2010.           Sexually active: No.  The current method of family planning is post menopausal status.    Exercising: Yes.     walking Smoker:  no  Health Maintenance: Pap:  08/03/18 neg: HR HPV neg, 06/15/15 neg History of abnormal Pap:  no MMG:  02/10/22 Breast Density Cat B, BI-RADS CAT 1 neg Colonoscopy:  cologuard 08/11/21 neg BMD:   02/11/14  Result  WNL TDaP:  2023 Gardasil:   no HIV: n/a Hep C: n/a Screening Labs:  PCP Shingrix discussed.    reports that she has never smoked. She has never used smokeless tobacco. She reports that she does not drink alcohol and does not use drugs.  Past Medical History:  Diagnosis Date   Allergy to alpha-gal 10/2018   Blood transfusion without reported diagnosis    age 47   Eczema    Fibroid    Foraminal stenosis of cervical region 2016   Hypertension    Hypothyroidism    PCOS (polycystic ovarian syndrome) 12/31/2019   Vitamin D deficiency     Past Surgical History:  Procedure Laterality Date   CESAREAN SECTION  1989    Current Outpatient Medications  Medication Sig Dispense Refill   amLODipine (NORVASC) 5 MG tablet Take 1 tablet (5 mg total) by mouth 2 (two) times daily. 180 tablet 1   ARMOUR THYROID 120 MG tablet Take 120 mg by mouth every morning.     bisoprolol-hydrochlorothiazide (ZIAC) 5-6.25 MG tablet Take 1 tablet by mouth daily. 90 tablet 1   Cholecalciferol (VITAMIN D3) 125 MCG (5000 UT) CAPS Take 2 capsules by mouth daily.      EPINEPHrine 0.3 mg/0.3 mL IJ SOAJ injection Inject 0.3 mg into the muscle as needed for anaphylaxis. 2 each 1   furosemide (LASIX) 40 MG tablet Take 1 tablet (40 mg total) by mouth daily as needed. 90 tablet 0   Multiple Vitamin (MULTIVITAMIN) tablet Take 1 tablet by mouth daily.     triamcinolone cream (KENALOG) 0.5 % Apply 1 application  topically 3 (three) times daily. 30 g 0   No current facility-administered medications for this visit.    Family History  Problem Relation Age of Onset   Breast cancer Sister 41   Hypertension Sister    Hypertension Mother    Thyroid disease Mother        thyroidectomy   Hypertension Father    Stroke Maternal Grandfather    Hypertension Sister    Hypertension Sister    Hypertension Sister    Hypertension Sister     Review of Systems  All other systems reviewed and are negative.   Exam:   BP 130/84 (BP Location: Left Arm, Patient Position: Sitting, Cuff Size: Normal)   Pulse 62   Ht 5\' 7"  (1.702 m)   Wt 207 lb (93.9 kg)   LMP 07/07/2010   SpO2 100%   BMI 32.42 kg/m     General appearance: alert, cooperative and appears stated age Head: normocephalic, without obvious abnormality, atraumatic Neck: no adenopathy, supple, symmetrical, trachea midline and thyroid normal to inspection and palpation Lungs: clear to auscultation bilaterally Breasts: normal appearance, no masses or tenderness, No nipple retraction or dimpling, No nipple discharge or bleeding, No axillary adenopathy Heart: regular rate and rhythm Abdomen: soft,  non-tender; no masses, no organomegaly Extremities: extremities normal, atraumatic, no cyanosis or edema Skin: skin color, texture, turgor normal. No rashes or lesions Lymph nodes: cervical, supraclavicular, and axillary nodes normal. Neurologic: grossly normal  Pelvic: External genitalia:  no lesions              No abnormal inguinal nodes palpated.              Urethra:  normal appearing urethra with no masses, tenderness or lesions              Bartholins and Skenes: normal                 Vagina: normal appearing vagina with normal color and discharge, no lesions              Cervix: no lesions              Pap taken: no Bimanual Exam:  Uterus:  normal size, contour, position, consistency, mobility, non-tender              Adnexa: no mass,  fullness, tenderness              Rectal exam: yes.  Confirms.              Anus:  normal sphincter tone, no lesions  Chaperone was present for exam:  Warren Lacy, CMA  Assessment:   Well woman visit with gynecologic exam. FH of breast cancer:  sister. Hypothyroidism.   Plan: Mammogram screening discussed. Self breast awareness reviewed. Pap and HR HPV 2025. Guidelines for Calcium, Vitamin D, regular exercise program including cardiovascular and weight bearing exercise. Labs with PCP.  Follow up annually and prn.

## 2022-10-11 ENCOUNTER — Ambulatory Visit (INDEPENDENT_AMBULATORY_CARE_PROVIDER_SITE_OTHER): Payer: BC Managed Care – PPO | Admitting: Obstetrics and Gynecology

## 2022-10-11 ENCOUNTER — Encounter: Payer: Self-pay | Admitting: Obstetrics and Gynecology

## 2022-10-11 VITALS — BP 130/84 | HR 62 | Ht 67.0 in | Wt 207.0 lb

## 2022-10-11 DIAGNOSIS — Z01419 Encounter for gynecological examination (general) (routine) without abnormal findings: Secondary | ICD-10-CM | POA: Diagnosis not present

## 2022-10-11 NOTE — Patient Instructions (Signed)

## 2023-01-10 NOTE — Progress Notes (Deleted)
GYNECOLOGY  VISIT   HPI: 62 y.o.   Married  [place]  female   [phone] with Patient's last menstrual period was 07/07/2010.   here for   hemorrhoids  GYNECOLOGIC HISTORY: Patient's last menstrual period was 07/07/2010. Contraception:  PMP Menopausal hormone therapy:  n/a Last mammogram:   02/10/22 Breast Density Cat B, BI-RADS CAT 1 neg  Last pap smear:    08/03/18 neg: HR HPV neg, 06/15/15 neg         OB History     Gravida  4   Para  2   Term  1   Preterm  0   AB  2   Living  1      SAB  0   IAB  0   Ectopic  0   Multiple  0   Live Births  1              Patient Active Problem List   Diagnosis Date Noted   PCOS (polycystic ovarian syndrome) 12/31/2019   Acute bacterial conjunctivitis of both eyes 08/01/2019   Other microscopic hematuria 04/27/2019   Hypertension 04/24/2019   Hypothyroidism 04/24/2019   Hyperlipidemia 04/24/2019   Chloasma 07/11/2018   Contact dermatitis and other eczema due to other chemical products 07/11/2018   Foot fracture 04/04/2013    Past Medical History:  Diagnosis Date   Allergy to alpha-gal 10/2018   Blood transfusion without reported diagnosis    age 75   Eczema    Fibroid    Foraminal stenosis of cervical region 2016   Hypertension    Hypothyroidism    PCOS (polycystic ovarian syndrome) 12/31/2019   Vitamin D deficiency     Past Surgical History:  Procedure Laterality Date   CESAREAN SECTION  1989    Current Outpatient Medications  Medication Sig Dispense Refill   amLODipine (NORVASC) 5 MG tablet Take 1 tablet (5 mg total) by mouth 2 (two) times daily. 180 tablet 1   ARMOUR THYROID 120 MG tablet Take 120 mg by mouth every morning.     bisoprolol-hydrochlorothiazide (ZIAC) 5-6.25 MG tablet Take 1 tablet by mouth daily. 90 tablet 1   Cholecalciferol (VITAMIN D3) 125 MCG (5000 UT) CAPS Take 2 capsules by mouth daily.      EPINEPHrine 0.3 mg/0.3 mL IJ SOAJ injection Inject 0.3 mg into the muscle as needed  for anaphylaxis. 2 each 1   furosemide (LASIX) 40 MG tablet Take 1 tablet (40 mg total) by mouth daily as needed. 90 tablet 0   Multiple Vitamin (MULTIVITAMIN) tablet Take 1 tablet by mouth daily.     triamcinolone cream (KENALOG) 0.5 % Apply 1 application topically 3 (three) times daily. 30 g 0   No current facility-administered medications for this visit.     ALLERGIES: Codeine, Elemental sulfur, Other, Sulfa antibiotics, and Alpha-gal  Family History  Problem Relation Age of Onset   Breast cancer Sister 10   Hypertension Sister    Hypertension Mother    Thyroid disease Mother        thyroidectomy   Hypertension Father    Stroke Maternal Grandfather    Hypertension Sister    Hypertension Sister    Hypertension Sister    Hypertension Sister     Social History   Socioeconomic History   Marital status: Married    Spouse name: Not on file   Number of children: Not on file   Years of education: Not on file   Highest education level:  Not on file  Occupational History   Not on file  Tobacco Use   Smoking status: Never   Smokeless tobacco: Never  Vaping Use   Vaping status: Never Used  Substance and Sexual Activity   Alcohol use: No    Alcohol/week: 0.0 standard drinks of alcohol   Drug use: No   Sexual activity: Not Currently    Partners: Male    Birth control/protection: Post-menopausal  Other Topics Concern   Not on file  Social History Narrative   Widow since 05/18/2020-was married for 32 years..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   Social Determinants of Health   Financial Resource Strain: Low Risk  (08/30/2022)   Received from [facility], [facility]   Overall Financial Resource Strain (CARDIA)    Difficulty of Paying Living Expenses: Not hard at all  Food Insecurity: No Food Insecurity (08/30/2022)   Received from [facility], [facility]   Hunger Vital Sign    Worried About Running Out of Food in the Last Year: Never true     Ran Out of Food in the Last Year: Never true  Transportation Needs: No Transportation Needs (08/30/2022)   Received from [facility], [facility]   PRAPARE - Transportation    Lack of Transportation (Medical): No    Lack of Transportation (Non-Medical): No  Physical Activity: Insufficiently Active (08/29/2022)   Received from [facility], [facility]   Exercise Vital Sign    Days of Exercise per Week: 5 days    Minutes of Exercise per Session: 20 min  Stress: No Stress Concern Present (08/29/2022)   Received from [facility], [facility] of [facility]ational Health - Occupational Stress Questionnaire    Feeling of Stress : Not at all  Social Connections: Moderately Integrated (08/29/2022)   Received from [facility], [facility]   Social Network    How would you rate your social network (family, work, friends)?: Adequate participation with social networks  Intimate Partner Violence: Unknown (09/08/2021)   Received from [facility], [facility]   HITS    Physically Hurt: Not on file    Insult or Talk Down To: Not on file    Threaten Physical Harm: Not on file    Scream or Curse: Not on file    Review of Systems  PHYSICAL EXAMINATION:    LMP 07/07/2010     General appearance: alert, cooperative and appears stated age Head: Normocephalic, without obvious abnormality, atraumatic Neck: no adenopathy, supple, symmetrical, trachea midline and thyroid normal to inspection and palpation Lungs: clear to auscultation bilaterally Breasts: normal appearance, no masses or tenderness, No nipple retraction or dimpling, No nipple discharge or bleeding, No axillary or supraclavicular adenopathy Heart: regular rate and rhythm Abdomen: soft, non-tender, no masses,  no organomegaly Extremities: extremities normal, atraumatic, no cyanosis or edema Skin: Skin color, texture, turgor normal. No rashes or lesions Lymph nodes: Cervical, supraclavicular, and axillary nodes  normal. No abnormal inguinal nodes palpated Neurologic: Grossly normal  Pelvic: External genitalia:  no lesions              Urethra:  normal appearing urethra with no masses, tenderness or lesions              Bartholins and Skenes: normal                 Vagina: normal appearing vagina with normal color and discharge, no lesions  Cervix: no lesions                Bimanual Exam:  Uterus:  normal size, contour, position, consistency, mobility, non-tender              Adnexa: no mass, fullness, tenderness              Rectal exam: {yes no:314532}.  Confirms.              Anus:  normal sphincter tone, no lesions  Chaperone was present for exam:  ***  ASSESSMENT     PLAN     An After Visit Summary was printed and given to the patient.  ______ minutes face to face time of which over 50% was spent in counseling.

## 2023-01-24 ENCOUNTER — Other Ambulatory Visit: Payer: Self-pay | Admitting: Obstetrics and Gynecology

## 2023-01-24 ENCOUNTER — Ambulatory Visit: Payer: BC Managed Care – PPO | Admitting: Obstetrics and Gynecology

## 2023-01-24 DIAGNOSIS — Z0000 Encounter for general adult medical examination without abnormal findings: Secondary | ICD-10-CM

## 2023-01-25 ENCOUNTER — Ambulatory Visit: Payer: BC Managed Care – PPO | Admitting: Obstetrics and Gynecology

## 2023-02-09 NOTE — Progress Notes (Deleted)
 GYNECOLOGY  VISIT   HPI: 62 y.o.   Married  [place]  female   [phone] with Patient's last menstrual period was 07/07/2010.   here for   hemorrhoids  GYNECOLOGIC HISTORY: Patient's last menstrual period was 07/07/2010. Contraception:  PMP Menopausal hormone therapy:  n/a Last mammogram:   02/10/22 Breast Density Cat B, BI-RADS CAT 1 neg  Last pap smear:    08/03/18 neg: HR HPV neg, 06/15/15 neg         OB History     Gravida  4   Para  2   Term  1   Preterm  0   AB  2   Living  1      SAB  0   IAB  0   Ectopic  0   Multiple  0   Live Births  1              Patient Active Problem List   Diagnosis Date Noted   PCOS (polycystic ovarian syndrome) 12/31/2019   Acute bacterial conjunctivitis of both eyes 08/01/2019   Other microscopic hematuria 04/27/2019   Hypertension 04/24/2019   Hypothyroidism 04/24/2019   Hyperlipidemia 04/24/2019   Chloasma 07/11/2018   Contact dermatitis and other eczema due to other chemical products 07/11/2018   Foot fracture 04/04/2013    Past Medical History:  Diagnosis Date   Allergy to alpha-gal 10/2018   Blood transfusion without reported diagnosis    age 75   Eczema    Fibroid    Foraminal stenosis of cervical region 2016   Hypertension    Hypothyroidism    PCOS (polycystic ovarian syndrome) 12/31/2019   Vitamin D deficiency     Past Surgical History:  Procedure Laterality Date   CESAREAN SECTION  1989    Current Outpatient Medications  Medication Sig Dispense Refill   amLODipine (NORVASC) 5 MG tablet Take 1 tablet (5 mg total) by mouth 2 (two) times daily. 180 tablet 1   ARMOUR THYROID 120 MG tablet Take 120 mg by mouth every morning.     bisoprolol-hydrochlorothiazide (ZIAC) 5-6.25 MG tablet Take 1 tablet by mouth daily. 90 tablet 1   Cholecalciferol (VITAMIN D3) 125 MCG (5000 UT) CAPS Take 2 capsules by mouth daily.      EPINEPHrine 0.3 mg/0.3 mL IJ SOAJ injection Inject 0.3 mg into the muscle as needed  for anaphylaxis. 2 each 1   furosemide (LASIX) 40 MG tablet Take 1 tablet (40 mg total) by mouth daily as needed. 90 tablet 0   Multiple Vitamin (MULTIVITAMIN) tablet Take 1 tablet by mouth daily.     triamcinolone cream (KENALOG) 0.5 % Apply 1 application topically 3 (three) times daily. 30 g 0   No current facility-administered medications for this visit.     ALLERGIES: Codeine, Elemental sulfur, Other, Sulfa antibiotics, and Alpha-gal  Family History  Problem Relation Age of Onset   Breast cancer Sister 10   Hypertension Sister    Hypertension Mother    Thyroid disease Mother        thyroidectomy   Hypertension Father    Stroke Maternal Grandfather    Hypertension Sister    Hypertension Sister    Hypertension Sister    Hypertension Sister     Social History   Socioeconomic History   Marital status: Married    Spouse name: Not on file   Number of children: Not on file   Years of education: Not on file   Highest education level:  Not on file  Occupational History   Not on file  Tobacco Use   Smoking status: Never   Smokeless tobacco: Never  Vaping Use   Vaping status: Never Used  Substance and Sexual Activity   Alcohol use: No    Alcohol/week: 0.0 standard drinks of alcohol   Drug use: No   Sexual activity: Not Currently    Partners: Male    Birth control/protection: Post-menopausal  Other Topics Concern   Not on file  Social History Narrative   Widow since 05/18/2020-was married for 32 years..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   Social Determinants of Health   Financial Resource Strain: Low Risk  (08/30/2022)   Received from [facility], [facility]   Overall Financial Resource Strain (CARDIA)    Difficulty of Paying Living Expenses: Not hard at all  Food Insecurity: No Food Insecurity (08/30/2022)   Received from [facility], [facility]   Hunger Vital Sign    Worried About Running Out of Food in the Last Year: Never true     Ran Out of Food in the Last Year: Never true  Transportation Needs: No Transportation Needs (08/30/2022)   Received from [facility], [facility]   PRAPARE - Transportation    Lack of Transportation (Medical): No    Lack of Transportation (Non-Medical): No  Physical Activity: Insufficiently Active (08/29/2022)   Received from [facility], [facility]   Exercise Vital Sign    Days of Exercise per Week: 5 days    Minutes of Exercise per Session: 20 min  Stress: No Stress Concern Present (08/29/2022)   Received from [facility], [facility] of [facility]ational Health - Occupational Stress Questionnaire    Feeling of Stress : Not at all  Social Connections: Moderately Integrated (08/29/2022)   Received from [facility], [facility]   Social Network    How would you rate your social network (family, work, friends)?: Adequate participation with social networks  Intimate Partner Violence: Unknown (09/08/2021)   Received from [facility], [facility]   HITS    Physically Hurt: Not on file    Insult or Talk Down To: Not on file    Threaten Physical Harm: Not on file    Scream or Curse: Not on file    Review of Systems  PHYSICAL EXAMINATION:    LMP 07/07/2010     General appearance: alert, cooperative and appears stated age Head: Normocephalic, without obvious abnormality, atraumatic Neck: no adenopathy, supple, symmetrical, trachea midline and thyroid normal to inspection and palpation Lungs: clear to auscultation bilaterally Breasts: normal appearance, no masses or tenderness, No nipple retraction or dimpling, No nipple discharge or bleeding, No axillary or supraclavicular adenopathy Heart: regular rate and rhythm Abdomen: soft, non-tender, no masses,  no organomegaly Extremities: extremities normal, atraumatic, no cyanosis or edema Skin: Skin color, texture, turgor normal. No rashes or lesions Lymph nodes: Cervical, supraclavicular, and axillary nodes  normal. No abnormal inguinal nodes palpated Neurologic: Grossly normal  Pelvic: External genitalia:  no lesions              Urethra:  normal appearing urethra with no masses, tenderness or lesions              Bartholins and Skenes: normal                 Vagina: normal appearing vagina with normal color and discharge, no lesions  Cervix: no lesions                Bimanual Exam:  Uterus:  normal size, contour, position, consistency, mobility, non-tender              Adnexa: no mass, fullness, tenderness              Rectal exam: {yes no:314532}.  Confirms.              Anus:  normal sphincter tone, no lesions  Chaperone was present for exam:  ***  ASSESSMENT     PLAN     An After Visit Summary was printed and given to the patient.  ______ minutes face to face time of which over 50% was spent in counseling.

## 2023-02-14 ENCOUNTER — Ambulatory Visit: Payer: BC Managed Care – PPO

## 2023-02-23 ENCOUNTER — Ambulatory Visit: Payer: BC Managed Care – PPO | Admitting: Obstetrics and Gynecology

## 2023-03-03 ENCOUNTER — Ambulatory Visit: Payer: BC Managed Care – PPO

## 2023-03-09 ENCOUNTER — Ambulatory Visit: Payer: BC Managed Care – PPO

## 2023-04-03 ENCOUNTER — Ambulatory Visit
Admission: RE | Admit: 2023-04-03 | Discharge: 2023-04-03 | Disposition: A | Discharge status: Home / Self Care | Payer: BC Managed Care – PPO | Source: Ambulatory Visit | Attending: Obstetrics and Gynecology | Admitting: Obstetrics and Gynecology

## 2023-04-03 DIAGNOSIS — Z0000 Encounter for general adult medical examination without abnormal findings: Secondary | ICD-10-CM

## 2023-05-22 ENCOUNTER — Ambulatory Visit
Admission: EM | Admit: 2023-05-22 | Discharge: 2023-05-22 | Disposition: A | Discharge status: Home / Self Care | Payer: BC Managed Care – PPO | Attending: Nurse Practitioner | Admitting: Nurse Practitioner

## 2023-05-22 DIAGNOSIS — S61211A Laceration without foreign body of left index finger without damage to nail, initial encounter: Secondary | ICD-10-CM

## 2023-05-22 MED ORDER — ACETAMINOPHEN 325 MG PO TABS
650.0000 mg | ORAL_TABLET | Freq: Once | ORAL | Status: AC
  Administered 2023-05-22: 650 mg via ORAL

## 2023-05-22 NOTE — Discharge Instructions (Addendum)
Dermabond was used to repair the laceration to your left index finger.  Do not pick or disrupt the adhesive.  It will fall off on its own. Keep the dressing in place for 24 hours. Remove the dressing and clean the area with warm water in 24 hours.  When you are at home, may leave the area open to air.  When you are out, recommend keeping the area covered. May apply Neosporin to the laceration as needed. May take over-the-counter Tylenol for pain or discomfort. Follow-up immediately if you develop swelling, increased redness that goes into the hand or up the arm, drainage, or if you develop fever, chills, or other concerns. Follow-up as needed.

## 2023-05-22 NOTE — ED Provider Notes (Addendum)
[facility]    CSN: [id number] Arrival date & time: 05/22/23  0803      History   Chief Complaint Chief Complaint  Patient presents with   Laceration    HPI Jessica Mooney is a 62 y.o. female.   The history is provided by the patient.   Patient presents with a laceration to the tip of the left index finger.  Patient was trying to open a bag using a knife when she cut her left index finger.  Patient complains of localized pain to the tip of the finger.  Bleeding is controlled at this time.  For patient, last tetanus shot was in 2022.  Denies history of bleeding disorders, she is also not on any anticoagulants currently.  Patient is right-hand dominant.  Past Medical History:  Diagnosis Date   Allergy to alpha-gal 10/2018   Blood transfusion without reported diagnosis    age 48   Eczema    Fibroid    Foraminal stenosis of cervical region 2016   Hypertension    Hypothyroidism    PCOS (polycystic ovarian syndrome) 12/31/2019   Vitamin D deficiency     Patient Active Problem List   Diagnosis Date Noted   PCOS (polycystic ovarian syndrome) 12/31/2019   Acute bacterial conjunctivitis of both eyes 08/01/2019   Other microscopic hematuria 04/27/2019   Hypertension 04/24/2019   Hypothyroidism 04/24/2019   Hyperlipidemia 04/24/2019   Chloasma 07/11/2018   Contact dermatitis and other eczema due to other chemical products 07/11/2018   Foot fracture 04/04/2013    Past Surgical History:  Procedure Laterality Date   CESAREAN SECTION  1989    OB History     Gravida  4   Para  2   Term  1   Preterm  0   AB  2   Living  1      SAB  0   IAB  0   Ectopic  0   Multiple  0   Live Births  1            Home Medications    Prior to Admission medications   Medication Sig Start Date End Date Taking? Authorizing Provider  amLODipine (NORVASC) 5 MG tablet Take 1 tablet (5 mg total) by mouth 2 (two) times daily. 12/21/20  Yes Gosrani, Nimish  C, MD  ARMOUR THYROID 120 MG tablet Take 120 mg by mouth every morning. 09/30/21  Yes [provider]  bisoprolol-hydrochlorothiazide (ZIAC) 5-6.25 MG tablet Take 1 tablet by mouth daily. 01/14/21  Yes Elenore Paddy, NP  Cholecalciferol (VITAMIN D3) 125 MCG (5000 UT) CAPS Take 2 capsules by mouth daily.    Yes [provider]  furosemide (LASIX) 40 MG tablet Take 1 tablet (40 mg total) by mouth daily as needed. 12/21/20  Yes Gosrani, Nimish C, MD  Multiple Vitamin (MULTIVITAMIN) tablet Take 1 tablet by mouth daily.   Yes [provider]  triamcinolone cream (KENALOG) 0.5 % Apply 1 application topically 3 (three) times daily. 02/04/20  Yes Gosrani, Nimish C, MD  EPINEPHrine 0.3 mg/0.3 mL IJ SOAJ injection Inject 0.3 mg into the muscle as needed for anaphylaxis. 10/28/21   Marcelyn Bruins, MD    Family History Family History  Problem Relation Age of Onset   Breast cancer Sister 58   Hypertension Sister    Hypertension Mother    Thyroid disease Mother        thyroidectomy   Hypertension Father  Stroke Maternal Grandfather    Hypertension Sister    Hypertension Sister    Hypertension Sister    Hypertension Sister     Social History Social History   Tobacco Use   Smoking status: Never   Smokeless tobacco: Never  Vaping Use   Vaping status: Never Used  Substance Use Topics   Alcohol use: No    Alcohol/week: 0.0 standard drinks of alcohol   Drug use: No     Allergies   Codeine, Elemental sulfur, Other, Sulfa antibiotics, and Alpha-gal   Review of Systems Review of Systems Per HPI  Physical Exam Triage Vital Signs ED Triage Vitals  Encounter Vitals Group     BP 05/22/23 0825 (!) 147/95     Systolic BP Percentile --      Diastolic BP Percentile --      Pulse Rate 05/22/23 0821 63     Resp 05/22/23 0821 16     Temp 05/22/23 0821 97.9 F (36.6 C)     Temp Source 05/22/23 0821 Oral     SpO2 05/22/23 0821 97 %     Weight --       Height --      Head Circumference --      Peak Flow --      Pain Score 05/22/23 0822 9     Pain Loc --      Pain Education --      Exclude from Growth Chart --    No data found.  Updated Vital Signs BP (!) 147/95   Pulse 63   Temp 97.9 F (36.6 C) (Oral)   Resp 16   LMP 07/07/2010   SpO2 97%   Visual Acuity Right Eye Distance:   Left Eye Distance:   Bilateral Distance:    Right Eye Near:   Left Eye Near:    Bilateral Near:     Physical Exam Vitals and nursing note reviewed.  Constitutional:      General: She is not in acute distress.    Appearance: Normal appearance.  HENT:     Head: Normocephalic.  Eyes:     Extraocular Movements: Extraocular movements intact.     Pupils: Pupils are equal, round, and reactive to light.  Pulmonary:     Effort: Pulmonary effort is normal.  Musculoskeletal:     Cervical back: Normal range of motion.  Skin:    General: Skin is warm and dry.     Findings: Laceration present.     Comments: 0.75 cm horizontal laceration noted to the palmar aspect of the distal tip of the left index finger.  Bleeding is controlled at this time.  Left index finger is vascularly intact. Cap refill < 2 sec.   Neurological:     General: No focal deficit present.     Mental Status: She is alert and oriented to person, place, and time.  Psychiatric:        Mood and Affect: Mood normal.        Behavior: Behavior normal.      UC Treatments / Results  Labs (all labs ordered are listed, but only abnormal results are displayed) Labs Reviewed - No data to display  EKG   Radiology No results found.  Procedures Laceration Repair  Date/Time: 05/22/2023 9:03 AM  Performed by: Abran Cantor, NP Authorized by: Abran Cantor, NP   Consent:    Consent obtained:  Verbal   Consent given by:  Patient   Risks discussed:  Need for additional repair, poor cosmetic result, infection and pain   Alternatives discussed:  No  treatment Universal protocol:    Procedure explained and questions answered to patient or proxy's satisfaction: yes     Patient identity confirmed:  Verbally with patient Anesthesia:    Anesthesia method:  None Laceration details:    Location:  Finger   Finger location:  L index finger   Length (cm):  0.8 Exploration:    Contaminated: no   Treatment:    Wound cleansed with: Hibiclens and normal saline.   Amount of cleaning:  Standard   Irrigation solution:  Tap water Skin repair:    Repair method:  Tissue adhesive Approximation:    Approximation:  Close Repair type:    Repair type:  Simple Post-procedure details:    Dressing:  Non-adherent dressing Comments:     0.75 cm laceration noted to the distal tip of the left index finger.  Area was cleansed with normal saline and Hibiclens soap.  Dermabond used for repair.  4 Steri-Strips applied with use of benzoin.  Antibiotic ointment with nonadherent dressing was applied.  Splint applied to the left index finger for protection.  Patient tolerated well.  (including critical care time)  Medications Ordered in UC Medications  acetaminophen (TYLENOL) tablet 650 mg (650 mg Oral Given 05/22/23 0829)    Initial Impression / Assessment and Plan / UC Course  I have reviewed the triage vital signs and the nursing notes.  Pertinent labs & imaging results that were available during my care of the patient were reviewed by me and considered in my medical decision making (see chart for details).  0.75 cm laceration repaired with Dermabond.  Patient tolerated well.  Tdap was up-to-date.  Nonadherent dressing with antibiotic ointment was applied.  Finger splint applied for protection.  Supportive care recommendations were provided and discussed with the patient to include keeping the dressing in place for the next 24 hours, signs of infection, and over-the-counter analgesics for pain or discomfort.  Patient was in agreement with this plan of care and  verbalized understanding.  All questions were answered.  Patient stable for discharge.   Final Clinical Impressions(s) / UC Diagnoses   Final diagnoses:  None   Discharge Instructions   None    ED Prescriptions   None    PDMP not reviewed this encounter.   Abran Cantor, NP 05/22/23 0908    Abran Cantor, NP 05/22/23 [phone]

## 2023-05-22 NOTE — ED Triage Notes (Addendum)
Pt states she was trying to open a bag and used a knife and cut her left pointer finger this morning.   Last tdap shot per pt and chart was in 2022.

## 2023-11-13 NOTE — Progress Notes (Unsigned)
 63 y.o. [id number] Married [place] American female here for annual exam.    PCP: Gerrianne Krauss, PA-C   Patient's last menstrual period was 07/07/2010.           Sexually active: No.  The current method of family planning is post menopausal status.    Menopausal hormone therapy:  n/a Exercising: {yes no:314532}  {types:19826} Smoker:  no  OB History  Gravida Para Term Preterm AB Living  4 2 1  0 2 1  SAB IAB Ectopic Multiple Live Births  0 0 0 0 1    # Outcome Date GA Lbr Len/2nd Weight Sex Type Anes PTL Lv  4 Para 11/03/91    F CS-Unspec   FD  3 Term 07/27/87 [redacted]w[redacted]d  8 lb 11 oz (3.941 kg) M CS-LTranv   LIV  2 AB           1 AB              HEALTH MAINTENANCE: Last 2 paps:  08/03/18 neg HPV neg History of abnormal Pap or positive HPV:  no Mammogram:   04/03/23 Breast Density Cat B, BIRADS Cat 1 neg  Colonoscopy:  n/a Bone Density:  02/11/14  Result  normal   Immunization History  Administered Date(s) Administered   Influenza-Unspecified 02/15/2013   MMR 01/02/1995   PFIZER(Purple Top)SARS-COV-2 Vaccination 10/17/2019, 11/08/2019, 10/02/2020   Td 01/02/1995, 10/21/2004   Tdap 04/27/2021      reports that she has never smoked. She has never used smokeless tobacco. She reports that she does not drink alcohol and does not use drugs.  Past Medical History:  Diagnosis Date   Allergy to alpha-gal 10/2018   Blood transfusion without reported diagnosis    age 66   Eczema    Fibroid    Foraminal stenosis of cervical region 2016   Hypertension    Hypothyroidism    PCOS (polycystic ovarian syndrome) 12/31/2019   Vitamin D deficiency     Past Surgical History:  Procedure Laterality Date   CESAREAN SECTION  1989    Current Outpatient Medications  Medication Sig Dispense Refill   amLODipine  (NORVASC ) 5 MG tablet Take 1 tablet (5 mg total) by mouth 2 (two) times daily. 180 tablet 1   ARMOUR THYROID  120 MG tablet Take 120 mg by mouth every morning.      bisoprolol -hydrochlorothiazide  (ZIAC ) 5-6.25 MG tablet Take 1 tablet by mouth daily. 90 tablet 1   Cholecalciferol (VITAMIN D3) 125 MCG (5000 UT) CAPS Take 2 capsules by mouth daily.      EPINEPHrine  0.3 mg/0.3 mL IJ SOAJ injection Inject 0.3 mg into the muscle as needed for anaphylaxis. 2 each 1   furosemide  (LASIX ) 40 MG tablet Take 1 tablet (40 mg total) by mouth daily as needed. 90 tablet 0   Multiple Vitamin (MULTIVITAMIN) tablet Take 1 tablet by mouth daily.     triamcinolone  cream (KENALOG ) 0.5 % Apply 1 application topically 3 (three) times daily. 30 g 0   No current facility-administered medications for this visit.    ALLERGIES: Codeine, Elemental sulfur, Other, Sulfa antibiotics, and Alpha-gal  Family History  Problem Relation Age of Onset   Breast cancer Sister 63   Hypertension Sister    Hypertension Mother    Thyroid  disease Mother        thyroidectomy   Hypertension Father    Stroke Maternal Grandfather    Hypertension Sister    Hypertension Sister    Hypertension Sister  Hypertension Sister     Review of Systems  PHYSICAL EXAM:  LMP 07/07/2010     General appearance: alert, cooperative and appears stated age Head: normocephalic, without obvious abnormality, atraumatic Neck: no adenopathy, supple, symmetrical, trachea midline and thyroid  normal to inspection and palpation Lungs: clear to auscultation bilaterally Breasts: normal appearance, no masses or tenderness, No nipple retraction or dimpling, No nipple discharge or bleeding, No axillary adenopathy Heart: regular rate and rhythm Abdomen: soft, non-tender; no masses, no organomegaly Extremities: extremities normal, atraumatic, no cyanosis or edema Skin: skin color, texture, turgor normal. No rashes or lesions Lymph nodes: cervical, supraclavicular, and axillary nodes normal. Neurologic: grossly normal  Pelvic: External genitalia:  no lesions              No abnormal inguinal nodes palpated.               Urethra:  normal appearing urethra with no masses, tenderness or lesions              Bartholins and Skenes: normal                 Vagina: normal appearing vagina with normal color and discharge, no lesions              Cervix: no lesions              Pap taken: {yes no:314532} Bimanual Exam:  Uterus:  normal size, contour, position, consistency, mobility, non-tender              Adnexa: no mass, fullness, tenderness              Rectal exam: {yes no:314532}.  Confirms.              Anus:  normal sphincter tone, no lesions  Chaperone was present for exam:  {BSCHAPERONE:31226::"Emily F, CMA"}  ASSESSMENT: Well woman visit with gynecologic exam.  PHQ-9: ***  ***  PLAN: Mammogram screening discussed. Self breast awareness reviewed. Pap and HRV collected:  {yes no:314532} Guidelines for Calcium, Vitamin D, regular exercise program including cardiovascular and weight bearing exercise. Medication refills:  *** {LABS (Optional):23779} Follow up:  ***    Additional counseling given.  {yes [id number]. ***  total time was spent for this patient encounter, including preparation, face-to-face counseling with the patient, coordination of care, and documentation of the encounter in addition to doing the well woman visit with gynecologic exam.

## 2023-11-14 ENCOUNTER — Encounter: Payer: Self-pay | Admitting: Obstetrics and Gynecology

## 2023-11-14 ENCOUNTER — Ambulatory Visit (INDEPENDENT_AMBULATORY_CARE_PROVIDER_SITE_OTHER): Payer: Self-pay | Admitting: Obstetrics and Gynecology

## 2023-11-14 ENCOUNTER — Other Ambulatory Visit: Payer: Self-pay | Admitting: Obstetrics and Gynecology

## 2023-11-14 ENCOUNTER — Other Ambulatory Visit (HOSPITAL_COMMUNITY)
Admission: RE | Admit: 2023-11-14 | Discharge: 2023-11-14 | Disposition: A | Discharge status: Home / Self Care | Source: Ambulatory Visit | Attending: Obstetrics and Gynecology | Admitting: Obstetrics and Gynecology

## 2023-11-14 VITALS — BP 132/84 | HR 67 | Ht 68.0 in | Wt 212.0 lb

## 2023-11-14 DIAGNOSIS — Z01419 Encounter for gynecological examination (general) (routine) without abnormal findings: Secondary | ICD-10-CM

## 2023-11-14 DIAGNOSIS — Z124 Encounter for screening for malignant neoplasm of cervix: Secondary | ICD-10-CM | POA: Insufficient documentation

## 2023-11-14 DIAGNOSIS — Z1231 Encounter for screening mammogram for malignant neoplasm of breast: Secondary | ICD-10-CM

## 2023-11-14 DIAGNOSIS — Z1331 Encounter for screening for depression: Secondary | ICD-10-CM | POA: Diagnosis not present

## 2023-11-14 NOTE — Patient Instructions (Signed)

## 2023-11-16 LAB — CYTOLOGY - PAP
Comment: NEGATIVE
Diagnosis: NEGATIVE
High risk HPV: NEGATIVE

## 2023-11-19 ENCOUNTER — Ambulatory Visit: Payer: Self-pay | Admitting: Obstetrics and Gynecology

## 2023-11-23 ENCOUNTER — Encounter: Payer: Self-pay | Admitting: Family Medicine

## 2023-11-23 ENCOUNTER — Ambulatory Visit: Admitting: Family Medicine

## 2023-11-23 ENCOUNTER — Other Ambulatory Visit: Payer: Self-pay

## 2023-11-23 VITALS — BP 122/76 | HR 72 | Temp 98.2°F | Resp 18 | Ht 67.0 in | Wt 213.0 lb

## 2023-11-23 DIAGNOSIS — J310 Chronic rhinitis: Secondary | ICD-10-CM | POA: Diagnosis not present

## 2023-11-23 DIAGNOSIS — L501 Idiopathic urticaria: Secondary | ICD-10-CM | POA: Insufficient documentation

## 2023-11-23 DIAGNOSIS — Z91018 Allergy to other foods: Secondary | ICD-10-CM | POA: Diagnosis not present

## 2023-11-23 DIAGNOSIS — L2084 Intrinsic (allergic) eczema: Secondary | ICD-10-CM | POA: Diagnosis not present

## 2023-11-23 NOTE — Progress Notes (Signed)
 [address] [place] [address] Dept: [phone]  FOLLOW UP NOTE  Patient ID: Anise Kerns, female    DOB: Mar 05, 1961  Age: 63 y.o. MRN: [id number] Date of Office Visit: 11/23/2023  Assessment  Chief Complaint: Follow-up (Pt reports developing a rash 2-3 months ago. Went to pcp and was given a series of prednisone . Went to derm and was given another round of prednisone  and cream. Pt states that after all this was still itchy but the rash started fading away. Derm took a biopsy and was told she was allergic to a medication she was taking called Bisprolol- hydrochlorizade. Rash, headaches still there. Unsure if she is actually allergic to it.)  HPI ALLIE OUSLEY is a 63 year old female who presents to the clinic for a follow-up visit with a new complaint of hives.  She was last seen in this clinic on 10/28/2021 by Dr. Tempie Fee for evaluation of alpha gal.  At that time, alpha gal IgE was 0.19.  She was advised to continue to avoid mammalian products.  At today's visit, she reports that she began to experience a raised, red, itchy rash that began approximately 2 months ago.  She reports: At time, that she went to her primary care provider on 10/03/2023 who started her on antihistamines as well as triamcinolone  with no relief.  She was referred to dermatology on 10/16/2023.  Notes from her dermatologist are not available at this time.  She does report that she had a skin biopsy during her visit to the dermatology clinic.  She reports that she began taking  Rinvoq, however, only takes this medication when she feels very itchy which is about 3 days a week.  Her dermatologist is unaware of her current medication pattern.  She was encouraged to speak with her dermatology specialist about the Rinvoq use.  She denies new foods, medications, personal care products, or insect stings.  She denies recent illness, fever, sweats, chills, or sick contacts.  She reports that no other people living in her  home have a similar rash.  She continues cetirizine as needed, however, reports that cetirizine is not relieving her itch.  She continues triamcinolone  as needed, however, reports that triamcinolone  is not relieving her symptoms.  She reports that she has not had hives prior to the beginning of this episode about 2 months ago.   She continues to avoid mammalian products with no accidental ingestion or EpiPen  use since her last visit to this clinic.  She is interested in retesting for alpha gal allergy at today's visit.  Allergic rhinitis is reported as moderately well-controlled with occasional nasal congestion and clear rhinorrhea.  She continues cetirizine and as needed and is not currently using a nasal spray or nasal saline rinse.  Her current medications are listed in the chart.  Drug Allergies:  Allergies  Allergen Reactions   Codeine     Other reaction(s): Other   Elemental Sulfur    Other Swelling and Other (See Comments)    Erythromycin  ophthalmic ointment   Sulfa Antibiotics     hives   Alpha-Gal Diarrhea, Nausea Only and Rash    Physical Exam: BP 122/76 (BP Location: Left Arm, Patient Position: Sitting, Cuff Size: Large)   Pulse 72   Temp 98.2 F (36.8 C) (Temporal)   Resp 18   Ht 5' 7 (1.702 m)   Wt 213 lb (96.6 kg)   LMP 07/07/2010   SpO2 96%   BMI 33.36 kg/m    Physical  Exam Vitals reviewed.  Constitutional:      Appearance: Normal appearance.  HENT:     Head: Normocephalic and atraumatic.     Right Ear: Tympanic membrane normal.     Left Ear: Tympanic membrane normal.     Nose:     Comments: Bilateral naris slightly erythematous with thin clear nasal drainage noted.  Pharynx normal.  Ears normal.  Eyes normal.    Mouth/Throat:     Pharynx: Oropharynx is clear.   Eyes:     Conjunctiva/sclera: Conjunctivae normal.    Cardiovascular:     Rate and Rhythm: Normal rate and regular rhythm.     Heart sounds: Normal heart sounds. No murmur  heard. Pulmonary:     Effort: Pulmonary effort is normal.     Breath sounds: Normal breath sounds.     Comments: Lungs clear to auscultation  Musculoskeletal:        General: Normal range of motion.     Cervical back: Normal range of motion and neck supple.   Skin:    General: Skin is warm and dry.   Neurological:     Mental Status: She is alert and oriented to person, place, and time.   Psychiatric:        Mood and Affect: Mood normal.        Behavior: Behavior normal.        Thought Content: Thought content normal.        Judgment: Judgment normal.    Assessment and Plan: 1. Idiopathic urticaria   2. Allergy to alpha-gal   3. Chronic rhinitis   4. Intrinsic atopic dermatitis     No orders of the defined types were placed in this encounter.   Patient Instructions  Hives (urticaria) Take the least amount of medications while remaining hive free Cetirizine (Zyrtec) 10mg  twice a day and famotidine (Pepcid) 20 mg twice a day. If no symptoms for 7-14 days then decrease to. Cetirizine (Zyrtec) 10mg  twice a day and famotidine (Pepcid) 20 mg once a day.  If no symptoms for 7-14 days then decrease to. Cetirizine (Zyrtec) 10mg  twice a day.  If no symptoms for 7-14 days then decrease to. Cetirizine (Zyrtec) 10mg  once a day.  May use Benadryl (diphenhydramine) as needed for breakthrough hives       If symptoms return, then step up dosage  Keep a detailed symptom journal including foods eaten, contact with allergens, medications taken, weather changes.  Some lab work has been entered at today's visit to help us  evaluate your hives.  We will call you when the results become available  Allergic rhinitis Continue cetirizine 10 mg once a day if needed for runny nose or itch Consider saline nasal rinses as needed for nasal symptoms. Use this before any medicated nasal sprays for best result A lab order has been placed at today's visit to help us  evaluate your environmental allergies.   We will call you with the results become available.  Alpha-gal Continue to avoid mammalian products. In case of an allergic reaction, take cetirizine 10 mg once every 24 hours or Benadryl  every  hours, and if life-threatening symptoms occur, inject with EpiPen  0.3 mg. A lab order has been placed at today's visit to help us  evaluate your alpha-gal allergy.  We will call you when the results become available.  Atopic dermatitis Continue to follow-up with your dermatology specialist.  Let your dermatology specialist know that you are not taking Rinvoq about 3 days a week Call  the clinic if this treatment plan is not working well for you.  Follow up in 2 months or sooner if needed.  Return in about 2 months (around 01/23/2024), or if symptoms worsen or fail to improve.    Thank you for the opportunity to care for this patient.  Please do not hesitate to contact me with questions.  Marinus Sic, FNP [facility] 

## 2023-11-23 NOTE — Patient Instructions (Signed)
 Hives (urticaria) Take the least amount of medications while remaining hive free Cetirizine (Zyrtec) 10mg  twice a day and famotidine (Pepcid) 20 mg twice a day. If no symptoms for 7-14 days then decrease to. Cetirizine (Zyrtec) 10mg  twice a day and famotidine (Pepcid) 20 mg once a day.  If no symptoms for 7-14 days then decrease to. Cetirizine (Zyrtec) 10mg  twice a day.  If no symptoms for 7-14 days then decrease to. Cetirizine (Zyrtec) 10mg  once a day.  May use Benadryl (diphenhydramine) as needed for breakthrough hives       If symptoms return, then step up dosage  Keep a detailed symptom journal including foods eaten, contact with allergens, medications taken, weather changes.  Some lab work has been entered at today's visit to help us  evaluate your hives.  We will call you when the results become available  Allergic rhinitis Continue cetirizine 10 mg once a day if needed for runny nose or itch Consider saline nasal rinses as needed for nasal symptoms. Use this before any medicated nasal sprays for best result A lab order has been placed at today's visit to help us  evaluate your environmental allergies.  We will call you with the results become available.  Alpha-gal Continue to avoid mammalian products. In case of an allergic reaction, take cetirizine 10 mg once every 24 hours or Benadryl  every  hours, and if life-threatening symptoms occur, inject with EpiPen  0.3 mg. A lab order has been placed at today's visit to help us  evaluate your alpha-gal allergy.  We will call you when the results become available.  Atopic dermatitis Continue to follow-up with your dermatology specialist.  Let your dermatology specialist know that you are not taking Rinvoq about 3 days a week Call the clinic if this treatment plan is not working well for you.  Follow up in 2 months or sooner if needed.

## 2023-11-27 ENCOUNTER — Ambulatory Visit: Admitting: Internal Medicine

## 2023-12-02 LAB — ALLERGENS, ZONE 2

## 2023-12-02 LAB — ALPHA-GAL PANEL
Allergen Lamb IgE: <0.1 kU/L
Beef IgE: <0.1 kU/L
IgE (Immunoglobulin E), Serum: 30 [IU]/mL (ref 6–495)
O215-IgE Alpha-Gal: <0.1 kU/L
Pork IgE: <0.1 kU/L

## 2023-12-02 LAB — CHRONIC URTICARIA (CU) EVAL
ALT: 8 IU/L (ref 0–32)
Basophils Absolute: 0.1 10*3/uL (ref 0.0–0.2)
Basos: 1 %
CRP: 4 mg/L (ref 0–10)
EOS (ABSOLUTE): 0.3 10*3/uL (ref 0.0–0.4)
Eos: 4 %
Hematocrit: 42.7 % (ref 34.0–46.6)
Hemoglobin: 14.3 g/dL (ref 11.1–15.9)
Immature Grans (Abs): 0 10*3/uL (ref 0.0–0.1)
Immature Granulocytes: 0 %
Lymphocytes Absolute: 2.4 10*3/uL (ref 0.7–3.1)
Lymphs: 26 %
MCH: 29.3 pg (ref 26.6–33.0)
MCHC: 33.5 g/dL (ref 31.5–35.7)
MCV: 88 fL (ref 79–97)
Monocytes Absolute: 0.5 10*3/uL (ref 0.1–0.9)
Monocytes: 6 %
Neutrophils Absolute: 6 10*3/uL (ref 1.4–7.0)
Neutrophils: 63 %
Platelets: 414 10*3/uL (ref 150–450)
Pooled Donor- BAT CU: 1.7 (ref 0.00–10.60)
RBC: 4.88 x10E6/uL (ref 3.77–5.28)
RDW: 13.5 % (ref 11.7–15.4)
Sed Rate: 3 mm/h (ref 0–40)
TSH: 0.632 u[IU]/mL (ref 0.450–4.500)
Thyroperoxidase Ab SerPl-aCnc: <9 [IU]/mL (ref 0–34)
WBC: 9.3 10*3/uL (ref 3.4–10.8)

## 2023-12-05 ENCOUNTER — Ambulatory Visit: Payer: Self-pay | Admitting: Family Medicine

## 2023-12-05 NOTE — Progress Notes (Signed)
 Can you please let this patient know that her alpha gal testing was negative. We can do an in clinic challenge to mammalian meats if she is interested. Would recommend avoiding mammalian products and have access to an epipen  set  until office challenge complete. Environmental allergy panel was negative. Chronic urticaria panel was normal- looks at CBC, autoimmune source of hives, thyroid  studies, kidney studies, and some inflammatory markers. Is she still having hives? Thank you

## 2024-01-04 ENCOUNTER — Ambulatory Visit (INDEPENDENT_AMBULATORY_CARE_PROVIDER_SITE_OTHER): Payer: BC Managed Care – PPO | Admitting: Otolaryngology

## 2024-01-04 ENCOUNTER — Encounter (INDEPENDENT_AMBULATORY_CARE_PROVIDER_SITE_OTHER): Payer: Self-pay | Admitting: Otolaryngology

## 2024-01-04 VITALS — BP 143/88 | HR 74

## 2024-01-04 DIAGNOSIS — H9393 Unspecified disorder of ear, bilateral: Secondary | ICD-10-CM | POA: Diagnosis not present

## 2024-01-04 DIAGNOSIS — H6983 Other specified disorders of Eustachian tube, bilateral: Secondary | ICD-10-CM

## 2024-01-06 DIAGNOSIS — H6983 Other specified disorders of Eustachian tube, bilateral: Secondary | ICD-10-CM | POA: Insufficient documentation

## 2024-01-06 NOTE — Progress Notes (Signed)
 Patient ID: Jessica Mooney, female   DOB: [birth year], 63 y.o.   MRN: [id number]  Cc: Clogging sensation in her ears  HPI: The patient is a 63 year old female who presents today complaining of clogging sensation in her ears.  She denies any significant otalgia, otorrhea, or vertigo.  She has a history of cerumen impaction.  Currently she denies any change in her hearing.  Exam: General: Communicates without difficulty, well nourished, no acute distress. Head: Normocephalic, no evidence injury, no tenderness, facial buttresses intact without stepoff. Face/sinus: No tenderness to palpation and percussion. Facial movement is normal and symmetric. Eyes: PERRL, EOMI. No scleral icterus, conjunctivae clear. Neuro: CN II exam reveals vision grossly intact.  No nystagmus at any point of gaze. Ears: Auricles well formed without lesions.  A small amount of cerumen is noted within the ear canals.  Ear canals are intact without mass or lesion.  No erythema or edema is appreciated.  The TMs are intact without fluid. Nose: External evaluation reveals normal support and skin without lesions.  Dorsum is intact.  Anterior rhinoscopy reveals congested mucosa over anterior aspect of inferior turbinates and intact septum.  No purulence noted. Oral:  Oral cavity and oropharynx are intact, symmetric, without erythema or edema.  Mucosa is moist without lesions. Neck: Full range of motion without pain.  There is no significant lymphadenopathy.  No masses palpable.  Thyroid  bed within normal limits to palpation.  Parotid glands and submandibular glands equal bilaterally without mass.  Trachea is midline. Neuro:  CN 2-12 grossly intact.   Assessment: 1.  A small amount of cerumen is noted within the ear canals bilaterally.  The cerumen is removed without difficulty. 2.  Her tympanic membranes and middle ear spaces are normal bilaterally. 3.  Possible eustachian tube dysfunction.  Plan: 1.  The physical exam findings are  reviewed with the patient. 2.  Valsalva exercise and Flonase  nasal spray as needed. 3.  The patient will return for reevaluation in 1 year, sooner if needed.

## 2024-04-03 ENCOUNTER — Ambulatory Visit
Admission: RE | Admit: 2024-04-03 | Discharge: 2024-04-03 | Disposition: A | Discharge status: Home / Self Care | Payer: Self-pay | Source: Ambulatory Visit | Attending: Obstetrics and Gynecology | Admitting: Obstetrics and Gynecology

## 2024-04-03 DIAGNOSIS — Z1231 Encounter for screening mammogram for malignant neoplasm of breast: Secondary | ICD-10-CM

## 2024-04-08 ENCOUNTER — Ambulatory Visit: Payer: Self-pay | Admitting: Obstetrics and Gynecology

## 2024-11-19 ENCOUNTER — Ambulatory Visit: Admitting: Obstetrics and Gynecology
